# Patient Record
Sex: Female | Born: 1965 | Race: White | Hispanic: No | Smoking: Never smoker
Health system: Southern US, Community
[De-identification: ages and names within clinical notes are randomized; demographics above are authoritative.]

## PROBLEM LIST (undated history)

## (undated) DIAGNOSIS — B029 Zoster without complications: Secondary | ICD-10-CM

## (undated) DIAGNOSIS — Z9109 Other allergy status, other than to drugs and biological substances: Secondary | ICD-10-CM

## (undated) DIAGNOSIS — G43909 Migraine, unspecified, not intractable, without status migrainosus: Secondary | ICD-10-CM

## (undated) DIAGNOSIS — J45990 Exercise induced bronchospasm: Secondary | ICD-10-CM

## (undated) HISTORY — DX: Exercise induced bronchospasm: J45.990

## (undated) HISTORY — DX: Migraine, unspecified, not intractable, without status migrainosus: G43.909

## (undated) HISTORY — PX: NASAL SEPTUM SURGERY: SHX37

## (undated) HISTORY — DX: Zoster without complications: B02.9

## (undated) HISTORY — PX: EYE SURGERY: SHX253

## (undated) HISTORY — PX: TONSILLECTOMY: SUR1361

## (undated) HISTORY — DX: Other allergy status, other than to drugs and biological substances: Z91.09

---

## 1997-08-28 ENCOUNTER — Other Ambulatory Visit: Admission: RE | Admit: 1997-08-28 | Discharge: 1997-08-28 | Payer: Self-pay | Admitting: Obstetrics and Gynecology

## 1997-10-16 ENCOUNTER — Ambulatory Visit (HOSPITAL_COMMUNITY): Admission: RE | Admit: 1997-10-16 | Discharge: 1997-10-16 | Payer: Self-pay | Admitting: Obstetrics and Gynecology

## 1998-03-19 ENCOUNTER — Inpatient Hospital Stay (HOSPITAL_COMMUNITY): Admission: AD | Admit: 1998-03-19 | Discharge: 1998-03-21 | Payer: Self-pay | Admitting: Obstetrics and Gynecology

## 2002-05-17 ENCOUNTER — Other Ambulatory Visit: Admission: RE | Admit: 2002-05-17 | Discharge: 2002-05-17 | Payer: Self-pay | Admitting: Obstetrics and Gynecology

## 2003-08-28 ENCOUNTER — Other Ambulatory Visit: Admission: RE | Admit: 2003-08-28 | Discharge: 2003-08-28 | Payer: Self-pay | Admitting: Obstetrics and Gynecology

## 2004-10-01 ENCOUNTER — Other Ambulatory Visit: Admission: RE | Admit: 2004-10-01 | Discharge: 2004-10-01 | Payer: Self-pay | Admitting: Obstetrics and Gynecology

## 2005-10-22 ENCOUNTER — Other Ambulatory Visit: Admission: RE | Admit: 2005-10-22 | Discharge: 2005-10-22 | Payer: Self-pay | Admitting: Obstetrics and Gynecology

## 2006-10-05 ENCOUNTER — Encounter: Admission: RE | Admit: 2006-10-05 | Discharge: 2006-10-05 | Payer: Self-pay | Admitting: Obstetrics and Gynecology

## 2006-11-16 ENCOUNTER — Ambulatory Visit: Payer: Self-pay | Admitting: Family Medicine

## 2006-11-16 DIAGNOSIS — J309 Allergic rhinitis, unspecified: Secondary | ICD-10-CM | POA: Insufficient documentation

## 2006-11-16 DIAGNOSIS — G43009 Migraine without aura, not intractable, without status migrainosus: Secondary | ICD-10-CM | POA: Insufficient documentation

## 2006-11-16 DIAGNOSIS — D649 Anemia, unspecified: Secondary | ICD-10-CM

## 2006-11-16 DIAGNOSIS — Z91011 Allergy to milk products: Secondary | ICD-10-CM

## 2006-11-16 DIAGNOSIS — J45990 Exercise induced bronchospasm: Secondary | ICD-10-CM | POA: Insufficient documentation

## 2006-12-07 ENCOUNTER — Other Ambulatory Visit: Admission: RE | Admit: 2006-12-07 | Discharge: 2006-12-07 | Payer: Self-pay | Admitting: Family Medicine

## 2006-12-07 ENCOUNTER — Encounter (INDEPENDENT_AMBULATORY_CARE_PROVIDER_SITE_OTHER): Payer: Self-pay | Admitting: Internal Medicine

## 2006-12-07 ENCOUNTER — Ambulatory Visit: Payer: Self-pay | Admitting: Family Medicine

## 2006-12-16 LAB — CONVERTED CEMR LAB
Albumin: 3.5 g/dL (ref 3.5–5.2)
Alkaline Phosphatase: 55 units/L (ref 39–117)
BUN: 6 mg/dL (ref 6–23)
Basophils Absolute: 0 10*3/uL (ref 0.0–0.1)
Cholesterol: 154 mg/dL (ref 0–200)
GFR calc Af Amer: 120 mL/min
HCT: 36 % (ref 36.0–46.0)
HDL: 34.9 mg/dL — ABNORMAL LOW (ref >39.0)
Hemoglobin: 12.2 g/dL (ref 12.0–15.0)
Lymphocytes Relative: 17.3 % (ref 12.0–46.0)
MCHC: 34 g/dL (ref 30.0–36.0)
MCV: 95.5 fL (ref 78.0–100.0)
Monocytes Absolute: 0.7 10*3/uL (ref 0.2–0.7)
Monocytes Relative: 10.8 % (ref 3.0–11.0)
Neutro Abs: 4.3 10*3/uL (ref 1.4–7.7)
Neutrophils Relative %: 69.1 % (ref 43.0–77.0)
Potassium: 4.2 meq/L (ref 3.5–5.1)
Sodium: 134 meq/L — ABNORMAL LOW (ref 135–145)
TSH: 1.41 microintl units/mL (ref 0.35–5.50)
Total Bilirubin: 0.6 mg/dL (ref 0.3–1.2)
Total Protein: 6.7 g/dL (ref 6.0–8.3)

## 2007-03-23 ENCOUNTER — Ambulatory Visit: Payer: Self-pay | Admitting: Family Medicine

## 2007-03-23 DIAGNOSIS — M25519 Pain in unspecified shoulder: Secondary | ICD-10-CM

## 2007-03-23 DIAGNOSIS — R21 Rash and other nonspecific skin eruption: Secondary | ICD-10-CM

## 2007-09-16 ENCOUNTER — Ambulatory Visit: Payer: Self-pay | Admitting: Internal Medicine

## 2007-09-16 DIAGNOSIS — L723 Sebaceous cyst: Secondary | ICD-10-CM

## 2007-09-30 ENCOUNTER — Telehealth (INDEPENDENT_AMBULATORY_CARE_PROVIDER_SITE_OTHER): Payer: Self-pay | Admitting: *Deleted

## 2007-10-21 ENCOUNTER — Encounter: Payer: Self-pay | Admitting: Internal Medicine

## 2007-10-24 ENCOUNTER — Telehealth: Payer: Self-pay | Admitting: Internal Medicine

## 2007-10-25 ENCOUNTER — Telehealth: Payer: Self-pay | Admitting: Internal Medicine

## 2010-01-10 ENCOUNTER — Ambulatory Visit: Payer: Self-pay | Admitting: Emergency Medicine

## 2010-06-17 NOTE — Progress Notes (Signed)
Summary: Refill Augmentin  Phone Note From Pharmacy Call back at [phone]   Caller: [facility] Call For: Dr. Alphonsus Sias  Summary of Call: Received refill request from pharmacy for Augmentin 875 mg. Take one by mouth twice daily for skin. # 20. Initial call taken by: Sydell Axon,  October 24, 2007 8:40 AM  Follow-up for Phone Call        okay to refill #20 x 0 If cyst doesn't clear or keeps recurring, needs to be checked again Follow-up by: Cindee Salt MD,  October 24, 2007 9:09 AM  Additional Follow-up for Phone Call Additional follow up Details #1::        Rx called to pharmacy.  Left detailed message on pt's VM. Additional Follow-up by: Sydell Axon,  October 24, 2007 9:47 AM

## 2010-11-06 ENCOUNTER — Ambulatory Visit: Payer: Self-pay | Admitting: Family Medicine

## 2012-11-17 ENCOUNTER — Ambulatory Visit: Payer: Self-pay | Admitting: Family Medicine

## 2013-04-11 ENCOUNTER — Ambulatory Visit (INDEPENDENT_AMBULATORY_CARE_PROVIDER_SITE_OTHER): Payer: BC Managed Care – PPO

## 2013-04-11 ENCOUNTER — Ambulatory Visit (INDEPENDENT_AMBULATORY_CARE_PROVIDER_SITE_OTHER): Payer: BC Managed Care – PPO | Admitting: Podiatry

## 2013-04-11 ENCOUNTER — Encounter: Payer: Self-pay | Admitting: Podiatry

## 2013-04-11 VITALS — BP 114/71 | HR 94 | Resp 16 | Ht 63.0 in | Wt 205.0 lb

## 2013-04-11 DIAGNOSIS — M79609 Pain in unspecified limb: Secondary | ICD-10-CM

## 2013-04-11 DIAGNOSIS — M7660 Achilles tendinitis, unspecified leg: Secondary | ICD-10-CM

## 2013-04-11 DIAGNOSIS — M79672 Pain in left foot: Secondary | ICD-10-CM

## 2013-04-11 MED ORDER — TRIAMCINOLONE ACETONIDE 10 MG/ML IJ SUSP
10.0000 mg | Freq: Once | INTRAMUSCULAR | Status: AC
  Administered 2013-04-11: 10 mg

## 2013-04-11 NOTE — Progress Notes (Signed)
  Subjective:    Patient ID: Hannah Chang, female    DOB: Jul 01, 1965, 47 y.o.   MRN: [id number]  HPI Comments: N PAIN L LEFT ACHILLES D AUGUST 2014 O SLOWLY C WORSE A SHOES, AFTER SITTING T 0     Review of Systems  Constitutional: Negative.   HENT: Negative.   Eyes: Negative.   Respiratory: Negative.   Cardiovascular: Negative.   Gastrointestinal: Negative.   Endocrine: Negative.   Genitourinary: Negative.   Musculoskeletal: Negative.   Skin: Negative.   Allergic/Immunologic: Positive for food allergies.  Neurological: Negative.   Hematological: Negative.   Psychiatric/Behavioral: Negative.        Objective:   Physical Exam        Assessment & Plan:

## 2013-04-12 NOTE — Progress Notes (Signed)
Subjective:     Patient ID: Hannah Chang, female   DOB: 08/14/1965, 47 y.o.   MRN: [id number]  HPI patient presents stating I have had trouble with this left heel for about 4 months. States that she's not been able to wear any shoes with Bax and and become sore when pressed on. She is able to walk in flip-flops without pain   Review of Systems  All other systems reviewed and are negative.       Objective:   Physical Exam  Nursing note and vitals reviewed. Constitutional: She is oriented to person, place, and time.  Cardiovascular: Intact distal pulses.   Musculoskeletal: Normal range of motion.  Neurological: She is oriented to person, place, and time.  Skin: Skin is warm.   patient is found to have inflammation and pain left Achilles medial side with fluid buildup noted at this area central and lateral is intact with no inflammation or pain noted. No equinus condition and muscle strength was found to be normal with normal neurovascular status     Assessment:     Probable medial Achilles tendinitis left with inflammation noted    Plan:     Reviewed H&P and x-ray. Advised on careful injection and did discuss risk of rupture associated with this. Patient wants to accept risk and today I did a sterile prep and carefully injected the medial side not putting it directly into the Achilles tendon with 3 mg dexamethasone Kenalog 5 mg Xylocaine and advised him reduced activity. Reappoint 3 weeks to recheck

## 2013-07-25 ENCOUNTER — Ambulatory Visit (INDEPENDENT_AMBULATORY_CARE_PROVIDER_SITE_OTHER): Payer: BC Managed Care – PPO | Admitting: Podiatry

## 2013-07-25 VITALS — BP 109/60 | HR 99 | Resp 16 | Ht 63.0 in | Wt 195.0 lb

## 2013-07-25 DIAGNOSIS — M7660 Achilles tendinitis, unspecified leg: Secondary | ICD-10-CM

## 2013-07-25 MED ORDER — TRIAMCINOLONE ACETONIDE 10 MG/ML IJ SUSP
10.0000 mg | Freq: Once | INTRAMUSCULAR | Status: AC
  Administered 2013-07-25: 10 mg

## 2013-07-25 NOTE — Progress Notes (Signed)
Subjective:     Patient ID: Hannah Chang, female   DOB: 02/16/1966, 48 y.o.   MRN: [id number]  HPI patient presents stating my heel started to hurt me again in the last few weeks and it seems to be when I'm wearing tighter type of shoes   Review of Systems     Objective:   Physical Exam Neurovascular status intact no health history changes noted with inflammation of the medial side Achilles right with a small area of redness that is very tender when pressed central and lateral band are very strong and functioning well    Assessment:     Appears to be medial band Achilles tendinitis right with inflammation    Plan:     Discussed one more injection along with him mobilization. Today I did a careful injection of the medial side after explaining chances or rupture to patient of which she is comfortable with I injected with dexamethasone Kenalog 3 mg 5 mg Xylocaine and then dispensed night splint with all instructions on usage reappoint to recheck again in the next 3 weeks or earlier if any issues should occur

## 2013-07-25 NOTE — Patient Instructions (Signed)

## 2014-08-09 ENCOUNTER — Ambulatory Visit: Payer: Self-pay

## 2014-10-18 ENCOUNTER — Telehealth: Payer: Self-pay

## 2014-10-18 MED ORDER — LEVONORGESTREL-ETHINYL ESTRAD 0.1-20 MG-MCG PO TABS: 1.0000 | ORAL_TABLET | Freq: Every day | ORAL | Status: DC

## 2014-10-18 NOTE — Telephone Encounter (Signed)
She is skipping the placebo week of pills and going straight into a new pack. She has run out early now. She wants to know if you can call in one pack to her local pharmacy to cover her until the next mail order pack comes in. [facility] [phone]

## 2015-01-14 ENCOUNTER — Telehealth: Payer: Self-pay

## 2015-01-14 NOTE — Telephone Encounter (Signed)
I tried to call the patient about a medication that we got a refill request for her from [facility] but it looks like this medication was refilled on 10/17/14 with 11 refills but at a different pharmacy. So I left a voicemail asking for her to return my call so I can find out what exactly is going on.

## 2015-01-16 NOTE — Telephone Encounter (Signed)
Patient called back this morning and did not know she had 11 refills on the medication but she is wanting to switch to a different pharmacy. Stated she was going to call and switch pharmacies. I told her to call me back if she has any issues.

## 2015-10-11 ENCOUNTER — Ambulatory Visit (INDEPENDENT_AMBULATORY_CARE_PROVIDER_SITE_OTHER): Payer: Self-pay | Admitting: Family Medicine

## 2015-10-11 ENCOUNTER — Encounter: Payer: Self-pay | Admitting: Family Medicine

## 2015-10-11 VITALS — BP 132/86 | HR 86 | Temp 99.0°F | Ht 62.6 in | Wt 220.0 lb

## 2015-10-11 DIAGNOSIS — Z3041 Encounter for surveillance of contraceptive pills: Secondary | ICD-10-CM

## 2015-10-11 DIAGNOSIS — B029 Zoster without complications: Secondary | ICD-10-CM

## 2015-10-11 MED ORDER — VALACYCLOVIR HCL 1 G PO TABS: 1000.0000 mg | ORAL_TABLET | Freq: Three times a day (TID) | ORAL | Status: DC

## 2015-10-11 MED ORDER — LEVONORGESTREL-ETHINYL ESTRAD 0.1-20 MG-MCG PO TABS: 1.0000 | ORAL_TABLET | Freq: Every day | ORAL | Status: DC

## 2015-10-11 NOTE — Patient Instructions (Signed)
Shingles Shingles is an infection that causes a painful skin rash and fluid-filled blisters. Shingles is caused by the same virus that causes chickenpox. Shingles only develops in people who:  Have had chickenpox.  Have gotten the chickenpox vaccine. (This is rare.) The first symptoms of shingles may be itching, tingling, or pain in an area on your skin. A rash will follow in a few days or weeks. The rash is usually on one side of the body in a bandlike or beltlike pattern. Over time, the rash turns into fluid-filled blisters that break open, scab over, and dry up. Medicines may:  Help you manage pain.  Help you recover more quickly.  Help to prevent long-term problems. HOME CARE Medicines  Take medicines only as told by your doctor.  Apply an anti-itch or numbing cream to the affected area as told by your doctor. Blister and Rash Care  Take a cool bath or put cool compresses on the area of the rash or blisters as told by your doctor. This may help with pain and itching.  Keep your rash covered with a loose bandage (dressing). Wear loose-fitting clothing.  Keep your rash and blisters clean with mild soap and cool water or as told by your doctor.  Check your rash every day for signs of infection. These include redness, swelling, and pain that lasts or gets worse.  Do not pick your blisters.  Do not scratch your rash. General Instructions  Rest as told by your doctor.  Keep all follow-up visits as told by your doctor. This is important.  Until your blisters scab over, your infection can cause chickenpox in people who have never had it or been vaccinated against it. To prevent this from happening, avoid touching other people or being around other people, especially:  Babies.  Pregnant women.  Children who have eczema.  Elderly people who have transplants.  People who have chronic illnesses, such as leukemia or AIDS. GET HELP IF:  Your pain does not get better with  medicine.  Your pain does not get better after the rash heals.  Your rash looks infected. Signs of infection include:  Redness.  Swelling.  Pain that lasts or gets worse. GET HELP RIGHT AWAY IF:  The rash is on your face or nose.  You have pain in your face, pain around your eye area, or loss of feeling on one side of your face.  You have ear pain or you have ringing in your ear.  You have loss of taste.  Your condition gets worse.   This information is not intended to replace advice given to you by your health care provider. Make sure you discuss any questions you have with your health care provider.   Document Released: 10/21/2007 Document Revised: 05/25/2014 Document Reviewed: 02/13/2014 Elsevier Interactive Patient Education 2016 [employer] Inc.  

## 2015-10-11 NOTE — Progress Notes (Signed)
BP 132/86 mmHg  Pulse 86  Temp(Src) 99 F (37.2 C)  Ht 5' 2.6" (1.59 m)  Wt 220 lb (99.791 kg)  BMI 39.47 kg/m2  SpO2 100%   Subjective:    Patient ID: Hannah Chang, female    DOB: 06/03/1965, 50 y.o.   MRN: [id number]  HPI: Hannah Chang is a 50 y.o. female  Chief Complaint  Patient presents with  . Rash    x 2 weeks on this coming Sunday.  . Contraception    Patient is scheduled with [facility] in July, she would like to know if she can get enough medication to hold her over   CONTRACEPTION CONCERNS Contraception: OCP Previous contraception: OCP Average interval between menses:  Length of menses: doesn't have one Flow: doesn't have one, dosed continuously Dysmenorrhea: doesn't have one, dosed continuously  RASH Duration:  2 weeks- thought it was insect bites  Location: L buttock  Itching: yes- first day Burning: no Redness: yes Oozing: no Scaling: no Blisters: yes- previously when it started Painful: no Fevers: no Change in detergents/soaps/personal care products: no Recent illness: no Recent travel:no History of same: no Context: better Alleviating factors: nothing Treatments attempted:hydrocortisone cream, benadryl and lotion/moisturizer Shortness of breath: no  Throat/tongue swelling: no Myalgias/arthralgias: no  Relevant past medical, surgical, family and social history reviewed and updated as indicated. Interim medical history since our last visit reviewed. Allergies and medications reviewed and updated.  Review of Systems  Constitutional: Negative.   Respiratory: Negative.   Cardiovascular: Negative.   Musculoskeletal: Negative.   Skin: Positive for rash. Negative for color change, pallor and wound.  Psychiatric/Behavioral: Negative.     Per HPI unless specifically indicated above     Objective:    BP 132/86 mmHg  Pulse 86  Temp(Src) 99 F (37.2 C)  Ht 5' 2.6" (1.59 m)  Wt 220 lb (99.791 kg)  BMI 39.47 kg/m2  SpO2 100%  Wt  Readings from Last 3 Encounters:  10/11/15 220 lb (99.791 kg)  07/25/13 195 lb (88.451 kg)  04/11/13 205 lb (92.987 kg)    Physical Exam  Constitutional: She is oriented to person, place, and time. She appears well-developed and well-nourished. No distress.  HENT:  Head: Normocephalic and atraumatic.  Right Ear: Hearing normal.  Left Ear: Hearing normal.  Nose: Nose normal.  Eyes: Conjunctivae and lids are normal. Right eye exhibits no discharge. Left eye exhibits no discharge. No scleral icterus.  Pulmonary/Chest: Effort normal. No respiratory distress.  Musculoskeletal: Normal range of motion.  Neurological: She is alert and oriented to person, place, and time.  Skin: Skin is warm, dry and intact. Rash (vessicular rash, dried up on L buttock) noted. No erythema. No pallor.  Psychiatric: She has a normal mood and affect. Her speech is normal and behavior is normal. Judgment and thought content normal. Cognition and memory are normal.  Nursing note and vitals reviewed.   Results for orders placed or performed in visit on 12/07/06  [facility]  Result Value Ref Range   Cholesterol 154 0-200 mg/dL   HDL 62.934.9 (L) >52.8>39.0 mg/dL   Triglycerides 56 08-1318-149 mg/dL   Total CHOL/HDL Ratio 4.4 CALC    LDL Cholesterol 108 (H) 0-99 mg/dL   VLDL 11 4-400-40 mg/dL   WBC 6.2 1.0-27.24.5-10.5 53*6/UYQIHKVQQV10*3/microliter   HCT 36.0 36.0-46.0 %   Hemoglobin 12.2 12.0-15.0 g/dL   Lymphocytes Relative 17.3 12.0-46.0 %   MCV 95.5 78.0-100.0 fL   MCHC 34.0 30.0-36.0 g/dL  Monocytes Relative 10.8 3.0-11.0 %   Platelets 346 150-400 K/uL   RBC 3.76 (L) 3.87-5.11 M/uL   RDW 11.9 11.5-14.6 %   Basophils Relative 0.5 0.0-1.0 %   Eosinophils Relative 2.3 0.0-5.0 %   Neutrophils Relative % 69.1 43.0-77.0 %   Monocytes Absolute 0.7 0.2-0.7 K/uL   Neutro Abs 4.3 1.4-7.7 K/uL   Eosinophils Absolute 0.1 0.0-0.6 K/uL   Basophils Absolute 0.0 0.0-0.1 K/uL   BUN 6 6-23 mg/dL   Calcium 8.8 1.6-10.9 mg/dL   Chloride 604 54-098  meq/L   Creatinine, Ser 0.7 0.4-1.2 mg/dL   CO2 25 11-91 meq/L   Glucose, Bld 103 (H) 70-99 mg/dL   Sodium 478 (L) 295-621 meq/L   Potassium 4.2 3.5-5.1 meq/L   GFR calc Af Amer 120 mL/min   GFR calc non Af Amer 100 mL/min   Albumin 3.5 3.5-5.2 g/dL   Alkaline Phosphatase 55 39-117 units/L   ALT 18 0-35 units/L   AST 17 0-37 units/L   Total Bilirubin 0.6 0.3-1.2 mg/dL   Total Protein 6.7 3.0-8.6 g/dL   Bilirubin, Direct 0.1 0.0-0.3 mg/dL   TSH 5.78 4.69-6.29 microintl units/mL      Assessment & Plan:   Problem List Items Addressed This Visit    None    Visit Diagnoses    Shingles    -  Primary    Already dried up. Valcyclovir Rx given in case it comes back. Discussed shingles. Call with any concerns.     Relevant Medications    valACYclovir (VALTREX) 1000 MG tablet    Encounter for surveillance of contraceptive pills        Refill of OCP given today. Follow up with GYN as needed.         Follow up plan: Return if symptoms worsen or fail to improve.

## 2015-10-28 ENCOUNTER — Telehealth: Payer: Self-pay | Admitting: Family Medicine

## 2015-10-28 MED ORDER — VALACYCLOVIR HCL 1 G PO TABS: 1000.0000 mg | ORAL_TABLET | Freq: Three times a day (TID) | ORAL | Status: DC

## 2015-10-28 MED ORDER — LIDOCAINE 5 % EX OINT: 1.0000 "application " | TOPICAL_OINTMENT | CUTANEOUS | Status: DC | PRN

## 2015-10-28 NOTE — Telephone Encounter (Signed)
Has been under a lot of stress. Has had another outbreak of her shingles again. She is going to restart her valtrex. Is painful at this time. Will start lidocaine topical to help with the pain. Will call with any concerns.

## 2015-10-28 NOTE — Telephone Encounter (Signed)
Pt would like to speak with someone about what she should expect while dealing with shingles.

## 2015-10-28 NOTE — Telephone Encounter (Signed)
Spoke with patient, she states that the rash has come back, she would like to know how often the rash will reappear, she is going to get the medication today, she is just in severe pain. She is unsure of what to do.

## 2015-10-29 ENCOUNTER — Other Ambulatory Visit: Payer: Self-pay | Admitting: Family Medicine

## 2015-10-29 MED ORDER — VALACYCLOVIR HCL 1 G PO TABS: 1000.0000 mg | ORAL_TABLET | Freq: Three times a day (TID) | ORAL | Status: DC

## 2015-11-11 ENCOUNTER — Telehealth: Payer: Self-pay

## 2015-11-11 MED ORDER — AMITRIPTYLINE HCL 25 MG PO TABS: 25.0000 mg | ORAL_TABLET | Freq: Every day | ORAL | Status: DC

## 2015-11-11 NOTE — Telephone Encounter (Signed)
Rx printed. Will not continue valtrex as it is just pain. Will start her on amitriptyline for the post-herpetic neuralgia. Rx sent to her.

## 2015-11-11 NOTE — Telephone Encounter (Signed)
Patient called nurse line concerned about Shingles, she wanted to know if she could continue to take Valtrex at a low dose to prevent another Shingles outbreak.  Verbal from Dr. Laural BenesJohnson, this will not prevent another out break, if she does have lesions, than yes she can take the low dose, if no lesions, dosen't need to continue the medicine, this is not used as a prophylactic drug. Called and left a message for patient to return my call so that I can discuss this with her.

## 2015-11-11 NOTE — Telephone Encounter (Signed)
Spoke with patient, she wants to know if she can continue the Valtrex to help with the pain that she still has after the initial break out of Shingles.

## 2015-11-21 ENCOUNTER — Telehealth: Payer: Self-pay | Admitting: Obstetrics and Gynecology

## 2015-11-21 ENCOUNTER — Encounter: Payer: Self-pay | Admitting: Obstetrics and Gynecology

## 2015-11-21 NOTE — Telephone Encounter (Addendum)
Pt needs 55mo refill on her birth control pills that she takes back to back. Pt was supposed to have AE on 11/21/15, but MD was in surgery and appt had to be rescheduled to mid Aug. Pt uses the [facility] on [address] since it's cheaper for people with out insurance

## 2015-11-22 ENCOUNTER — Telehealth: Payer: Self-pay | Admitting: Unknown Physician Specialty

## 2015-11-22 MED ORDER — LEVONORGESTREL-ETHINYL ESTRAD 0.1-20 MG-MCG PO TABS: 1.0000 | ORAL_TABLET | Freq: Every day | ORAL | Status: DC

## 2015-11-22 NOTE — Telephone Encounter (Signed)
Pt's appt w [facility] got rescheduled to Aug 23.  She will need 2 more packs of Aviane birthcontrol to get through to her appt.  Can we please send in for this?  [facility] [address]

## 2015-11-22 NOTE — Telephone Encounter (Signed)
Dr. Henriette CombsJohnson's pt

## 2015-11-22 NOTE — Telephone Encounter (Signed)
Left message to contact PCP that she has not been seen by Dr. Valentino Saxonherry yet and apologized for the inconvience. Until we see her we cannot prescribe her medication.

## 2015-11-22 NOTE — Telephone Encounter (Signed)
Routing to provider  

## 2015-11-22 NOTE — Telephone Encounter (Signed)
Rx sent to her pharmacy 

## 2016-01-08 ENCOUNTER — Ambulatory Visit (INDEPENDENT_AMBULATORY_CARE_PROVIDER_SITE_OTHER): Payer: Self-pay | Admitting: Obstetrics and Gynecology

## 2016-01-08 ENCOUNTER — Other Ambulatory Visit: Payer: Self-pay | Admitting: Obstetrics and Gynecology

## 2016-01-08 ENCOUNTER — Encounter: Payer: Self-pay | Admitting: Obstetrics and Gynecology

## 2016-01-08 VITALS — BP 126/76 | HR 90 | Ht 62.5 in | Wt 227.0 lb

## 2016-01-08 DIAGNOSIS — Z8742 Personal history of other diseases of the female genital tract: Secondary | ICD-10-CM

## 2016-01-08 DIAGNOSIS — Z1239 Encounter for other screening for malignant neoplasm of breast: Secondary | ICD-10-CM

## 2016-01-08 DIAGNOSIS — Z803 Family history of malignant neoplasm of breast: Secondary | ICD-10-CM

## 2016-01-08 DIAGNOSIS — Z131 Encounter for screening for diabetes mellitus: Secondary | ICD-10-CM

## 2016-01-08 DIAGNOSIS — Z13220 Encounter for screening for lipoid disorders: Secondary | ICD-10-CM

## 2016-01-08 DIAGNOSIS — Z01419 Encounter for gynecological examination (general) (routine) without abnormal findings: Secondary | ICD-10-CM

## 2016-01-08 DIAGNOSIS — Z124 Encounter for screening for malignant neoplasm of cervix: Secondary | ICD-10-CM

## 2016-01-08 MED ORDER — LEVONORGESTREL-ETHINYL ESTRAD 0.1-20 MG-MCG PO TABS: 1.0000 | ORAL_TABLET | Freq: Every day | ORAL | 11 refills | Status: DC

## 2016-01-08 NOTE — Progress Notes (Signed)
GYNECOLOGY ANNUAL PHYSICAL EXAM PROGRESS NOTE  Subjective:    Hannah Chang is a 50 y.o. G86P2002 single female who presents for an annual exam. The patient has no complaints today. The patient is not currently sexually active. The patient wears seatbelts: yes. The patient participates in regular exercise: yes. Has the patient ever been transfused or tattooed?: no. The patient reports that there is not domestic violence in her life.    Gynecologic History No LMP recorded. Patient is not currently having periods (Reason: Oral contraceptives). Menstrual History: OB History    Gravida Para Term Preterm AB Living   2 2 2     2    SAB TAB Ectopic Multiple Live Births           2      Menarche age: 4 No LMP recorded. Patient is not currently having periods (Reason: Oral contraceptives).    Contraception: OCP (estrogen/progesterone) History of STI's: Denies Last Pap: 2016. Results were: abnormal (patient cannot recall abnormality but notes she had a colposcopy last year which was normal.  Last mammogram: 2016. Results were: negative   Obstetric History   G2   P2   T2   P0   A0   L2    SAB0   TAB0   Ectopic0   Multiple0   Live Births2     # Outcome Date GA Lbr Len/2nd Weight Sex Delivery Anes PTL Lv  2 Term     M Vag-Spont   LIV  1 Term     F CS-Unspec   LIV      Past Medical History:  Diagnosis Date  . Environmental allergies   . Exercise-induced asthma   . Migraines   . Shingles     Past Surgical History:  Procedure Laterality Date  . CESAREAN SECTION  1992  . EYE SURGERY    . NASAL SEPTUM SURGERY     X 2  . TONSILLECTOMY      Family History  Problem Relation Age of Onset  . Breast cancer Mother     Social History   Social History  . Marital status: Divorced    Spouse name: N/A  . Number of children: N/A  . Years of education: N/A   Occupational History  . Not on file.   Social History Main Topics  . Smoking status: Never Smoker  . Smokeless  tobacco: Never Used  . Alcohol use Yes     Comment: ONCE A MONTH  . Drug use: No  . Sexual activity: Not Currently    Birth control/ protection: None   Other Topics Concern  . Not on file   Social History Narrative  . No narrative on file    Current Outpatient Prescriptions on File Prior to Visit  Medication Sig Dispense Refill  . Albuterol Sulfate (PROAIR HFA IN) Inhale into the lungs as needed.    . fexofenadine (ALLEGRA) 180 MG tablet Take 180 mg by mouth as needed for allergies or rhinitis.    . fluticasone (FLONASE) 50 MCG/ACT nasal spray Place into both nostrils daily.    Marland Kitchen levonorgestrel-ethinyl estradiol (AVIANE) 0.1-20 MG-MCG tablet Take 1 tablet by mouth daily. TO BE TAKEN CONTINUOUSLY 3 Package 1  . lidocaine (XYLOCAINE) 5 % ointment Apply 1 application topically as needed. 35.44 g 0  . valACYclovir (VALTREX) 1000 MG tablet Take 1 tablet (1,000 mg total) by mouth 3 (three) times daily. Take at first sign of rash 21 tablet 2  No current facility-administered medications on file prior to visit.     Allergies  Allergen Reactions  . Cephalexin     REACTION: whelps  . Doxycycline Hyclate     REACTION: as child  . Erythromycin     REACTION: as child  . Sulfa Antibiotics Other (See Comments)    UNKNOWN     Review of Systems Constitutional: negative for chills, fatigue, fevers and sweats Eyes: negative for irritation, redness and visual disturbance Ears, nose, mouth, throat, and face: negative for hearing loss, nasal congestion, snoring and tinnitus Respiratory: negative for asthma, cough, sputum Cardiovascular: negative for chest pain, dyspnea, exertional chest pressure/discomfort, irregular heart beat, palpitations and syncope Gastrointestinal: negative for abdominal pain, change in bowel habits, nausea and vomiting Genitourinary: negative for abnormal menstrual periods, genital lesions, sexual problems and vaginal discharge, dysuria and urinary  incontinence Integument/breast: negative for breast lump, breast tenderness and nipple discharge Hematologic/lymphatic: negative for bleeding and easy bruising Musculoskeletal:negative for back pain and muscle weakness Neurological: negative for dizziness, headaches, vertigo and weakness Endocrine: negative for diabetic symptoms including polydipsia, polyuria and skin dryness Allergic/Immunologic: negative for hay fever and urticaria       Objective:  Blood pressure 126/76, pulse 90, height 5' 2.5" (1.588 m), weight 227 lb (103 kg). Body mass index is 40.86 kg/m.  General Appearance:    Alert, cooperative, no distress, appears stated age, morbidly obese  Head:    Normocephalic, without obvious abnormality, atraumatic  Eyes:    PERRL, conjunctiva/corneas clear, EOM's intact, both eyes  Ears:    Normal external ear canals, both ears  Nose:   Nares normal, septum midline, mucosa normal, no drainage or sinus tenderness  Throat:   Lips, mucosa, and tongue normal; teeth and gums normal  Neck:   Supple, symmetrical, trachea midline, no adenopathy; thyroid: no enlargement/tenderness/nodules; no carotid bruit or JVD  Back:     Symmetric, no curvature, ROM normal, no CVA tenderness  Lungs:     Clear to auscultation bilaterally, respirations unlabored  Chest Wall:    No tenderness or deformity   Heart:    Regular rate and rhythm, S1 and S2 normal, no murmur, rub or gallop  Breast Exam:    No tenderness, masses, or nipple abnormality  Abdomen:     Soft, non-tender, bowel sounds active all four quadrants, no masses, no organomegaly.    Genitalia:    Pelvic:external genitalia normal, vagina without lesions, discharge, or tenderness, rectovaginal septum  normal. Cervix normal in appearance, no cervical motion tenderness, no adnexal masses or tenderness.  Uterus normal size, shape, mobile, regular contours, nontender.  Rectal:    Normal external sphincter.  No hemorrhoids appreciated. Internal exam not  done.   Extremities:   Extremities normal, atraumatic, no cyanosis or edema  Pulses:   2+ and symmetric all extremities  Skin:   Skin color, texture, turgor normal, no rashes or lesions  Lymph nodes:   Cervical, supraclavicular, and axillary nodes normal  Neurologic:   CNII-XII intact, normal strength, sensation and reflexes throughout   .  Labs:  Lab Results  Component Value Date   WBC 6.2 12/07/2006   HGB 12.2 12/07/2006   HCT 36.0 12/07/2006   MCV 95.5 12/07/2006   PLT 346 12/07/2006    Lab Results  Component Value Date   CREATININE 0.7 12/07/2006   BUN 6 12/07/2006   NA 134 (L) 12/07/2006   K 4.2 12/07/2006   CL 104 12/07/2006   CO2 25 12/07/2006  Lab Results  Component Value Date   ALT 18 12/07/2006   AST 17 12/07/2006   ALKPHOS 55 12/07/2006   BILITOT 0.6 12/07/2006    Lab Results  Component Value Date   TSH 1.41 12/07/2006     Assessment:   Routine gynecologic exam.  Morbid obesity H/o abnormal pap smear Family h/o breast cancer  Plan:    Blood tests ordered: CBC, CMP, TSH, Lipid panel, random glucose.  Patient will return fasting.  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  Breast self exam technique reviewed and patient encouraged to perform self-exam monthly. Contraception: OCP (estrogen/progesterone) and desires refill.  Refill given. Discussed healthy lifestyle modifications. Pap smear performed today for screening and h/o abnormal pap smear. Mammogram ordered. Family h/o breast cancer (in mother).  Discussion had on genetic screening.  Patient currently without insurance, but may consider once she does. Given handout.  To f/u in 1 year for annual exam.     Hildred LaserAnika Tanee Henery, MD [facility]

## 2016-01-08 NOTE — Patient Instructions (Addendum)
Health Maintenance, Female Adopting a healthy lifestyle and getting preventive care can go a long way to promote health and wellness. Talk with your health care provider about what schedule of regular examinations is right for you. This is a good chance for you to check in with your provider about disease prevention and staying healthy. In between checkups, there are plenty of things you can do on your own. Experts have done a lot of research about which lifestyle changes and preventive measures are most likely to keep you healthy. Ask your health care provider for more information. WEIGHT AND DIET  Eat a healthy diet  Be sure to include plenty of vegetables, fruits, low-fat dairy products, and lean protein.  Do not eat a lot of foods high in solid fats, added sugars, or salt.  Get regular exercise. This is one of the most important things you can do for your health.  Most adults should exercise for at least 150 minutes each week. The exercise should increase your heart rate and make you sweat (moderate-intensity exercise).  Most adults should also do strengthening exercises at least twice a week. This is in addition to the moderate-intensity exercise.  Maintain a healthy weight  Body mass index (BMI) is a measurement that can be used to identify possible weight problems. It estimates body fat based on height and weight. Your health care provider can help determine your BMI and help you achieve or maintain a healthy weight.  For females 20 years of age and older:   A BMI below 18.5 is considered underweight.  A BMI of 18.5 to 24.9 is normal.  A BMI of 25 to 29.9 is considered overweight.  A BMI of 30 and above is considered obese.  Watch levels of cholesterol and blood lipids  You should start having your blood tested for lipids and cholesterol at 48 years of age, then have this test every 5 years.  You may need to have your cholesterol levels checked more often if:  Your lipid  or cholesterol levels are high.  You are older than 48 years of age.  You are at high risk for heart disease.  CANCER SCREENING   Lung Cancer  Lung cancer screening is recommended for adults 55-80 years old who are at high risk for lung cancer because of a history of smoking.  A yearly low-dose CT scan of the lungs is recommended for people who:  Currently smoke.  Have quit within the past 15 years.  Have at least a 30-pack-year history of smoking. A pack year is smoking an average of one pack of cigarettes a day for 1 year.  Yearly screening should continue until it has been 15 years since you quit.  Yearly screening should stop if you develop a health problem that would prevent you from having lung cancer treatment.  Breast Cancer  Practice breast self-awareness. This means understanding how your breasts normally appear and feel.  It also means doing regular breast self-exams. Let your health care provider know about any changes, no matter how small.  If you are in your 20s or 30s, you should have a clinical breast exam (CBE) by a health care provider every 1-3 years as part of a regular health exam.  If you are 40 or older, have a CBE every year. Also consider having a breast X-ray (mammogram) every year.  If you have a family history of breast cancer, talk to your health care provider about genetic screening.  If you   are at high risk for breast cancer, talk to your health care provider about having an MRI and a mammogram every year.  Breast cancer gene (BRCA) assessment is recommended for women who have family members with BRCA-related cancers. BRCA-related cancers include:  Breast.  Ovarian.  Tubal.  Peritoneal cancers.  Results of the assessment will determine the need for genetic counseling and BRCA1 and BRCA2 testing. Cervical Cancer Your health care provider may recommend that you be screened regularly for cancer of the pelvic organs (ovaries, uterus, and  vagina). This screening involves a pelvic examination, including checking for microscopic changes to the surface of your cervix (Pap test). You may be encouraged to have this screening done every 3 years, beginning at age 21.  For women ages 30-65, health care providers may recommend pelvic exams and Pap testing every 3 years, or they may recommend the Pap and pelvic exam, combined with testing for human papilloma virus (HPV), every 5 years. Some types of HPV increase your risk of cervical cancer. Testing for HPV may also be done on women of any age with unclear Pap test results.  Other health care providers may not recommend any screening for nonpregnant women who are considered low risk for pelvic cancer and who do not have symptoms. Ask your health care provider if a screening pelvic exam is right for you.  If you have had past treatment for cervical cancer or a condition that could lead to cancer, you need Pap tests and screening for cancer for at least 20 years after your treatment. If Pap tests have been discontinued, your risk factors (such as having a new sexual partner) need to be reassessed to determine if screening should resume. Some women have medical problems that increase the chance of getting cervical cancer. In these cases, your health care provider may recommend more frequent screening and Pap tests. Colorectal Cancer  This type of cancer can be detected and often prevented.  Routine colorectal cancer screening usually begins at 48 years of age and continues through 48 years of age.  Your health care provider may recommend screening at an earlier age if you have risk factors for colon cancer.  Your health care provider may also recommend using home test kits to check for hidden blood in the stool.  A small camera at the end of a tube can be used to examine your colon directly (sigmoidoscopy or colonoscopy). This is done to check for the earliest forms of colorectal  cancer.  Routine screening usually begins at age 50.  Direct examination of the colon should be repeated every 5-10 years through 48 years of age. However, you may need to be screened more often if early forms of precancerous polyps or small growths are found. Skin Cancer  Check your skin from head to toe regularly.  Tell your health care provider about any new moles or changes in moles, especially if there is a change in a mole's shape or color.  Also tell your health care provider if you have a mole that is larger than the size of a pencil eraser.  Always use sunscreen. Apply sunscreen liberally and repeatedly throughout the day.  Protect yourself by wearing long sleeves, pants, a wide-brimmed hat, and sunglasses whenever you are outside. HEART DISEASE, DIABETES, AND HIGH BLOOD PRESSURE   High blood pressure causes heart disease and increases the risk of stroke. High blood pressure is more likely to develop in:  People who have blood pressure in the high end   of the normal range (130-139/85-89 mm Hg).  People who are overweight or obese.  People who are African American.  If you are 38-23 years of age, have your blood pressure checked every 3-5 years. If you are 61 years of age or older, have your blood pressure checked every year. You should have your blood pressure measured twice--once when you are at a hospital or clinic, and once when you are not at a hospital or clinic. Record the average of the two measurements. To check your blood pressure when you are not at a hospital or clinic, you can use:  An automated blood pressure machine at a pharmacy.  A home blood pressure monitor.  If you are between 45 years and 39 years old, ask your health care provider if you should take aspirin to prevent strokes.  Have regular diabetes screenings. This involves taking a blood sample to check your fasting blood sugar level.  If you are at a normal weight and have a low risk for diabetes,  have this test once every three years after 48 years of age.  If you are overweight and have a high risk for diabetes, consider being tested at a younger age or more often. PREVENTING INFECTION  Hepatitis B  If you have a higher risk for hepatitis B, you should be screened for this virus. You are considered at high risk for hepatitis B if:  You were born in a country where hepatitis B is common. Ask your health care provider which countries are considered high risk.  Your parents were born in a high-risk country, and you have not been immunized against hepatitis B (hepatitis B vaccine).  You have HIV or AIDS.  You use needles to inject street drugs.  You live with someone who has hepatitis B.  You have had sex with someone who has hepatitis B.  You get hemodialysis treatment.  You take certain medicines for conditions, including cancer, organ transplantation, and autoimmune conditions. Hepatitis C  Blood testing is recommended for:  Everyone born from [birth year] through 1963.  Anyone with known risk factors for hepatitis C. Sexually transmitted infections (STIs)  You should be screened for sexually transmitted infections (STIs) including gonorrhea and chlamydia if:  You are sexually active and are younger than 48 years of age.  You are older than 48 years of age and your health care provider tells you that you are at risk for this type of infection.  Your sexual activity has changed since you were last screened and you are at an increased risk for chlamydia or gonorrhea. Ask your health care provider if you are at risk.  If you do not have HIV, but are at risk, it may be recommended that you take a prescription medicine daily to prevent HIV infection. This is called pre-exposure prophylaxis (PrEP). You are considered at risk if:  You are sexually active and do not regularly use condoms or know the HIV status of your partner(s).  You take drugs by injection.  You are sexually  active with a partner who has HIV. Talk with your health care provider about whether you are at high risk of being infected with HIV. If you choose to begin PrEP, you should first be tested for HIV. You should then be tested every 3 months for as long as you are taking PrEP.  PREGNANCY   If you are premenopausal and you may become pregnant, ask your health care provider about preconception counseling.  If you may  are considered at risk if:    You are sexually active and do not regularly use condoms or know the HIV status of your partner(s).    You take drugs by injection.    You are sexually  active with a partner who has HIV.  Talk with your health care provider about whether you are at high risk of being infected with HIV. If you choose to begin PrEP, you should first be tested for HIV. You should then be tested every 3 months for as long as you are taking PrEP.   PREGNANCY    If you are premenopausal and you may become pregnant, ask your health care provider about preconception counseling.   If you may become pregnant, take 400 to 800 micrograms (mcg) of folic acid every day.   If you want to prevent pregnancy, talk to your health care provider about birth control (contraception).  OSTEOPOROSIS AND MENOPAUSE    Osteoporosis is a disease in which the bones lose minerals and strength with aging. This can result in serious bone fractures. Your risk for osteoporosis can be identified using a bone density scan.   If you are 65 years of age or older, or if you are at risk for osteoporosis and fractures, ask your health care provider if you should be screened.   Ask your health care provider whether you should take a calcium or vitamin D supplement to lower your risk for osteoporosis.   Menopause may have certain physical symptoms and risks.   Hormone replacement therapy may reduce some of these symptoms and risks.  Talk to your health care provider about whether hormone replacement therapy is right for you.   HOME CARE INSTRUCTIONS    Schedule regular health, dental, and eye exams.   Stay current with your immunizations.    Do not use any tobacco products including cigarettes, chewing tobacco, or electronic cigarettes.   If you are pregnant, do not drink alcohol.   If you are breastfeeding, limit how much and how often you drink alcohol.   Limit alcohol intake to no more than 1 drink per day for nonpregnant women. One drink equals 12 ounces of beer, 5 ounces of wine, or 1 ounces of hard liquor.   Do not use street drugs.   Do not share needles.   Ask your health care provider for help if  you need support or information about quitting drugs.   Tell your health care provider if you often feel depressed.   Tell your health care provider if you have ever been abused or do not feel safe at home.     This information is not intended to replace advice given to you by your health care provider. Make sure you discuss any questions you have with your health care provider.     Document Released: 11/17/2010 Document Revised: 05/25/2014 Document Reviewed: 04/05/2013  [employer] Interactive Patient Education 2016 [employer]  Preventive Care for Adults, Female  A healthy lifestyle and preventive care can promote health and wellness. Preventive health guidelines for women include the following key practices.   A routine yearly physical is a good way to check with your health care provider about your health and preventive screening. It is a chance to share any concerns and updates on your health and to receive a thorough exam.   Visit your dentist for a routine exam and preventive care every 6 months. Brush your teeth twice a day and floss once a day. Good oral   hygiene prevents tooth decay and gum disease.   The frequency of eye exams is based on your age, health, family medical history, use of contact lenses, and other factors. Follow your health care provider's recommendations for frequency of eye exams.   Eat a healthy diet. Foods like vegetables, fruits, whole grains, low-fat dairy products, and lean protein foods contain the nutrients you need without too many calories. Decrease your intake of foods high in solid fats, added sugars, and salt. Eat the right amount of calories for you.Get information about a proper diet from your health care provider, if necessary.   Regular physical exercise is one of the most important things you can do for your health. Most adults should get at least 150 minutes of moderate-intensity exercise (any activity that increases your heart rate and causes you to sweat) each  week. In addition, most adults need muscle-strengthening exercises on 2 or more days a week.   Maintain a healthy weight. The body mass index (BMI) is a screening tool to identify possible weight problems. It provides an estimate of body fat based on height and weight. Your health care provider can find your BMI and can help you achieve or maintain a healthy weight.For adults 20 years and older:    A BMI below 18.5 is considered underweight.    A BMI of 18.5 to 24.9 is normal.    A BMI of 25 to 29.9 is considered overweight.    A BMI of 30 and above is considered obese.   Maintain normal blood lipids and cholesterol levels by exercising and minimizing your intake of saturated fat. Eat a balanced diet with plenty of fruit and vegetables. Blood tests for lipids and cholesterol should begin at age 20 and be repeated every 5 years. If your lipid or cholesterol levels are high, you are over 50, or you are at high risk for heart disease, you may need your cholesterol levels checked more frequently.Ongoing high lipid and cholesterol levels should be treated with medicines if diet and exercise are not working.   If you smoke, find out from your health care provider how to quit. If you do not use tobacco, do not start.   Lung cancer screening is recommended for adults aged 55-80 years who are at high risk for developing lung cancer because of a history of smoking. A yearly low-dose CT scan of the lungs is recommended for people who have at least a 30-pack-year history of smoking and are a current smoker or have quit within the past 15 years. A pack year of smoking is smoking an average of 1 pack of cigarettes a day for 1 year (for example: 1 pack a day for 30 years or 2 packs a day for 15 years). Yearly screening should continue until the smoker has stopped smoking for at least 15 years. Yearly screening should be stopped for people who develop a health problem that would prevent them from having lung cancer  treatment.   If you are pregnant, do not drink alcohol. If you are breastfeeding, be very cautious about drinking alcohol. If you are not pregnant and choose to drink alcohol, do not have more than 1 drink per day. One drink is considered to be 12 ounces (355 mL) of beer, 5 ounces (148 mL) of wine, or 1.5 ounces (44 mL) of liquor.   Avoid use of street drugs. Do not share needles with anyone. Ask for help if you need support or instructions about stopping the use   of drugs.   High blood pressure causes heart disease and increases the risk of stroke. Your blood pressure should be checked at least every 1 to 2 years. Ongoing high blood pressure should be treated with medicines if weight loss and exercise do not work.   If you are 55-79 years old, ask your health care provider if you should take aspirin to prevent strokes.   Diabetes screening is done by taking a blood sample to check your blood glucose level after you have not eaten for a certain period of time (fasting). If you are not overweight and you do not have risk factors for diabetes, you should be screened once every 3 years starting at age 45. If you are overweight or obese and you are 40-70 years of age, you should be screened for diabetes every year as part of your cardiovascular risk assessment.   Breast cancer screening is essential preventive care for women. You should practice "breast self-awareness." This means understanding the normal appearance and feel of your breasts and may include breast self-examination. Any changes detected, no matter how small, should be reported to a health care provider. Women in their 20s and 30s should have a clinical breast exam (CBE) by a health care provider as part of a regular health exam every 1 to 3 years. After age 40, women should have a CBE every year. Starting at age 40, women should consider having a mammogram (breast X-ray test) every year. Women who have a family history of breast cancer should talk to  their health care provider about genetic screening. Women at a high risk of breast cancer should talk to their health care providers about having an MRI and a mammogram every year.   Breast cancer gene (BRCA)-related cancer risk assessment is recommended for women who have family members with BRCA-related cancers. BRCA-related cancers include breast, ovarian, tubal, and peritoneal cancers. Having family members with these cancers may be associated with an increased risk for harmful changes (mutations) in the breast cancer genes BRCA1 and BRCA2. Results of the assessment will determine the need for genetic counseling and BRCA1 and BRCA2 testing.   Your health care provider may recommend that you be screened regularly for cancer of the pelvic organs (ovaries, uterus, and vagina). This screening involves a pelvic examination, including checking for microscopic changes to the surface of your cervix (Pap test). You may be encouraged to have this screening done every 3 years, beginning at age 21.    For women ages 30-65, health care providers may recommend pelvic exams and Pap testing every 3 years, or they may recommend the Pap and pelvic exam, combined with testing for human papilloma virus (HPV), every 5 years. Some types of HPV increase your risk of cervical cancer. Testing for HPV may also be done on women of any age with unclear Pap test results.    Other health care providers may not recommend any screening for nonpregnant women who are considered low risk for pelvic cancer and who do not have symptoms. Ask your health care provider if a screening pelvic exam is right for you.   If you have had past treatment for cervical cancer or a condition that could lead to cancer, you need Pap tests and screening for cancer for at least 20 years after your treatment. If Pap tests have been discontinued, your risk factors (such as having a new sexual partner) need to be reassessed to determine if screening should resume.  Some women have medical   problems that increase the chance of getting cervical cancer. In these cases, your health care provider may recommend more frequent screening and Pap tests.   Colorectal cancer can be detected and often prevented. Most routine colorectal cancer screening begins at the age of 50 years and continues through age 75 years. However, your health care provider may recommend screening at an earlier age if you have risk factors for colon cancer. On a yearly basis, your health care provider may provide home test kits to check for hidden blood in the stool. Use of a small camera at the end of a tube, to directly examine the colon (sigmoidoscopy or colonoscopy), can detect the earliest forms of colorectal cancer. Talk to your health care provider about this at age 50, when routine screening begins. Direct exam of the colon should be repeated every 5-10 years through age 75 years, unless early forms of precancerous polyps or small growths are found.   People who are at an increased risk for hepatitis B should be screened for this virus. You are considered at high risk for hepatitis B if:    You were born in a country where hepatitis B occurs often. Talk with your health care provider about which countries are considered high risk.    Your parents were born in a high-risk country and you have not received a shot to protect against hepatitis B (hepatitis B vaccine).    You have HIV or AIDS.    You use needles to inject street drugs.    You live with, or have sex with, someone who has hepatitis B.    You get hemodialysis treatment.    You take certain medicines for conditions like cancer, organ transplantation, and autoimmune conditions.   Hepatitis C blood testing is recommended for all people born from 1945 through 1965 and any individual with known risks for hepatitis C.   Practice safe sex. Use condoms and avoid high-risk sexual practices to reduce the spread of sexually transmitted infections  (STIs). STIs include gonorrhea, chlamydia, syphilis, trichomonas, herpes, HPV, and human immunodeficiency virus (HIV). Herpes, HIV, and HPV are viral illnesses that have no cure. They can result in disability, cancer, and death.   You should be screened for sexually transmitted illnesses (STIs) including gonorrhea and chlamydia if:    You are sexually active and are younger than 24 years.    You are older than 24 years and your health care provider tells you that you are at risk for this type of infection.    Your sexual activity has changed since you were last screened and you are at an increased risk for chlamydia or gonorrhea. Ask your health care provider if you are at risk.   If you are at risk of being infected with HIV, it is recommended that you take a prescription medicine daily to prevent HIV infection. This is called preexposure prophylaxis (PrEP). You are considered at risk if:    You are sexually active and do not regularly use condoms or know the HIV status of your partner(s).    You take drugs by injection.    You are sexually active with a partner who has HIV.    Talk with your health care provider about whether you are at high risk of being infected with HIV. If you choose to begin PrEP, you should first be tested for HIV. You should then be tested every 3 months for as long as you are taking PrEP.   Osteoporosis is   a disease in which the bones lose minerals and strength with aging. This can result in serious bone fractures or breaks. The risk of osteoporosis can be identified using a bone density scan. Women ages 65 years and over and women at risk for fractures or osteoporosis should discuss screening with their health care providers. Ask your health care provider whether you should take a calcium supplement or vitamin D to reduce the rate of osteoporosis.   Menopause can be associated with physical symptoms and risks. Hormone replacement therapy is available to decrease symptoms and risks.  You should talk to your health care provider about whether hormone replacement therapy is right for you.   Use sunscreen. Apply sunscreen liberally and repeatedly throughout the day. You should seek shade when your shadow is shorter than you. Protect yourself by wearing long sleeves, pants, a wide-brimmed hat, and sunglasses year round, whenever you are outdoors.   Once a month, do a whole body skin exam, using a mirror to look at the skin on your back. Tell your health care provider of new moles, moles that have irregular borders, moles that are larger than a pencil eraser, or moles that have changed in shape or color.   Stay current with required vaccines (immunizations).    Influenza vaccine. All adults should be immunized every year.    Tetanus, diphtheria, and acellular pertussis (Td, Tdap) vaccine. Pregnant women should receive 1 dose of Tdap vaccine during each pregnancy. The dose should be obtained regardless of the length of time since the last dose. Immunization is preferred during the 27th-36th week of gestation. An adult who has not previously received Tdap or who does not know her vaccine status should receive 1 dose of Tdap. This initial dose should be followed by tetanus and diphtheria toxoids (Td) booster doses every 10 years. Adults with an unknown or incomplete history of completing a 3-dose immunization series with Td-containing vaccines should begin or complete a primary immunization series including a Tdap dose. Adults should receive a Td booster every 10 years.    Varicella vaccine. An adult without evidence of immunity to varicella should receive 2 doses or a second dose if she has previously received 1 dose. Pregnant females who do not have evidence of immunity should receive the first dose after pregnancy. This first dose should be obtained before leaving the health care facility. The second dose should be obtained 4-8 weeks after the first dose.    Human papillomavirus (HPV) vaccine.  Females aged 13-26 years who have not received the vaccine previously should obtain the 3-dose series. The vaccine is not recommended for use in pregnant females. However, pregnancy testing is not needed before receiving a dose. If a female is found to be pregnant after receiving a dose, no treatment is needed. In that case, the remaining doses should be delayed until after the pregnancy. Immunization is recommended for any person with an immunocompromised condition through the age of 26 years if she did not get any or all doses earlier. During the 3-dose series, the second dose should be obtained 4-8 weeks after the first dose. The third dose should be obtained 24 weeks after the first dose and 16 weeks after the second dose.    Zoster vaccine. One dose is recommended for adults aged 60 years or older unless certain conditions are present.    Measles, mumps, and rubella (MMR) vaccine. Adults born before 1957 generally are considered immune to measles and mumps. Adults born in 1957 or   later should have 1 or more doses of MMR vaccine unless there is a contraindication to the vaccine or there is laboratory evidence of immunity to each of the three diseases. A routine second dose of MMR vaccine should be obtained at least 28 days after the first dose for students attending postsecondary schools, health care workers, or international travelers. People who received inactivated measles vaccine or an unknown type of measles vaccine during 1963-1967 should receive 2 doses of MMR vaccine. People who received inactivated mumps vaccine or an unknown type of mumps vaccine before 1979 and are at high risk for mumps infection should consider immunization with 2 doses of MMR vaccine. For females of childbearing age, rubella immunity should be determined. If there is no evidence of immunity, females who are not pregnant should be vaccinated. If there is no evidence of immunity, females who are pregnant should delay immunization  until after pregnancy. Unvaccinated health care workers born before 1957 who lack laboratory evidence of measles, mumps, or rubella immunity or laboratory confirmation of disease should consider measles and mumps immunization with 2 doses of MMR vaccine or rubella immunization with 1 dose of MMR vaccine.    Pneumococcal 13-valent conjugate (PCV13) vaccine. When indicated, a person who is uncertain of his immunization history and has no record of immunization should receive the PCV13 vaccine. All adults 65 years of age and older should receive this vaccine. An adult aged 19 years or older who has certain medical conditions and has not been previously immunized should receive 1 dose of PCV13 vaccine. This PCV13 should be followed with a dose of pneumococcal polysaccharide (PPSV23) vaccine. Adults who are at high risk for pneumococcal disease should obtain the PPSV23 vaccine at least 8 weeks after the dose of PCV13 vaccine. Adults older than 48 years of age who have normal immune system function should obtain the PPSV23 vaccine dose at least 1 year after the dose of PCV13 vaccine.    Pneumococcal polysaccharide (PPSV23) vaccine. When PCV13 is also indicated, PCV13 should be obtained first. All adults aged 65 years and older should be immunized. An adult younger than age 65 years who has certain medical conditions should be immunized. Any person who resides in a nursing home or long-term care facility should be immunized. An adult smoker should be immunized. People with an immunocompromised condition and certain other conditions should receive both PCV13 and PPSV23 vaccines. People with human immunodeficiency virus (HIV) infection should be immunized as soon as possible after diagnosis. Immunization during chemotherapy or radiation therapy should be avoided. Routine use of PPSV23 vaccine is not recommended for American Indians, [place] Natives, or people younger than 65 years unless there are medical conditions that  require PPSV23 vaccine. When indicated, people who have unknown immunization and have no record of immunization should receive PPSV23 vaccine. One-time revaccination 5 years after the first dose of PPSV23 is recommended for people aged 19-64 years who have chronic kidney failure, nephrotic syndrome, asplenia, or immunocompromised conditions. People who received 1-2 doses of PPSV23 before age 65 years should receive another dose of PPSV23 vaccine at age 65 years or later if at least 5 years have passed since the previous dose. Doses of PPSV23 are not needed for people immunized with PPSV23 at or after age 65 years.    Meningococcal vaccine. Adults with asplenia or persistent complement component deficiencies should receive 2 doses of quadrivalent meningococcal conjugate (MenACWY-D) vaccine. The doses should be obtained at least 2 months apart. Microbiologists working with certain   meningococcal bacteria, military recruits, people at risk during an outbreak, and people who travel to or live in countries with a high rate of meningitis should be immunized. A first-year college student up through age 21 years who is living in a residence hall should receive a dose if she did not receive a dose on or after her 16th birthday. Adults who have certain high-risk conditions should receive one or more doses of vaccine.    Hepatitis A vaccine. Adults who wish to be protected from this disease, have certain high-risk conditions, work with hepatitis A-infected animals, work in hepatitis A research labs, or travel to or work in countries with a high rate of hepatitis A should be immunized. Adults who were previously unvaccinated and who anticipate close contact with an international adoptee during the first 60 days after arrival in the [place] from a country with a high rate of hepatitis A should be immunized.    Hepatitis B vaccine. Adults who wish to be protected from this disease, have certain high-risk conditions, may be  exposed to blood or other infectious body fluids, are household contacts or sex partners of hepatitis B positive people, are clients or workers in certain care facilities, or travel to or work in countries with a high rate of hepatitis B should be immunized.    Haemophilus influenzae type b (Hib) vaccine. A previously unvaccinated person with asplenia or sickle cell disease or having a scheduled splenectomy should receive 1 dose of Hib vaccine. Regardless of previous immunization, a recipient of a hematopoietic stem cell transplant should receive a 3-dose series 6-12 months after her successful transplant. Hib vaccine is not recommended for adults with HIV infection.  Preventive Services / Frequency  Ages 19 to 39 years   Blood pressure check.** / Every 3-5 years.   Lipid and cholesterol check.** / Every 5 years beginning at age 20.   Clinical breast exam.** / Every 3 years for women in their 20s and 30s.   BRCA-related cancer risk assessment.** / For women who have family members with a BRCA-related cancer (breast, ovarian, tubal, or peritoneal cancers).   Pap test.** / Every 2 years from ages 21 through 29. Every 3 years starting at age 30 through age 65 or 70 with a history of 3 consecutive normal Pap tests.   HPV screening.** / Every 3 years from ages 30 through ages 65 to 70 with a history of 3 consecutive normal Pap tests.   Hepatitis C blood test.** / For any individual with known risks for hepatitis C.   Skin self-exam. / Monthly.   Influenza vaccine. / Every year.   Tetanus, diphtheria, and acellular pertussis (Tdap, Td) vaccine.** / Consult your health care provider. Pregnant women should receive 1 dose of Tdap vaccine during each pregnancy. 1 dose of Td every 10 years.   Varicella vaccine.** / Consult your health care provider. Pregnant females who do not have evidence of immunity should receive the first dose after pregnancy.   HPV vaccine. / 3 doses over 6 months, if 26 and younger. The  vaccine is not recommended for use in pregnant females. However, pregnancy testing is not needed before receiving a dose.   Measles, mumps, rubella (MMR) vaccine.** / You need at least 1 dose of MMR if you were born in 1957 or later. You may also need a 2nd dose. For females of childbearing age, rubella immunity should be determined. If there is no evidence of immunity, females who are not pregnant   should be vaccinated. If there is no evidence of immunity, females who are pregnant should delay immunization until after pregnancy.   Pneumococcal 13-valent conjugate (PCV13) vaccine.** / Consult your health care provider.   Pneumococcal polysaccharide (PPSV23) vaccine.** / 1 to 2 doses if you smoke cigarettes or if you have certain conditions.   Meningococcal vaccine.** / 1 dose if you are age 19 to 21 years and a first-year college student living in a residence hall, or have one of several medical conditions, you need to get vaccinated against meningococcal disease. You may also need additional booster doses.   Hepatitis A vaccine.** / Consult your health care provider.   Hepatitis B vaccine.** / Consult your health care provider.   Haemophilus influenzae type b (Hib) vaccine.** / Consult your health care provider.  Ages 40 to 64 years   Blood pressure check.** / Every year.   Lipid and cholesterol check.** / Every 5 years beginning at age 20 years.   Lung cancer screening. / Every year if you are aged 55-80 years and have a 30-pack-year history of smoking and currently smoke or have quit within the past 15 years. Yearly screening is stopped once you have quit smoking for at least 15 years or develop a health problem that would prevent you from having lung cancer treatment.   Clinical breast exam.** / Every year after age 40 years.   BRCA-related cancer risk assessment.** / For women who have family members with a BRCA-related cancer (breast, ovarian, tubal, or peritoneal cancers).   Mammogram.** / Every  year beginning at age 40 years and continuing for as long as you are in good health. Consult with your health care provider.   Pap test.** / Every 3 years starting at age 30 years through age 65 or 70 years with a history of 3 consecutive normal Pap tests.   HPV screening.** / Every 3 years from ages 30 years through ages 65 to 70 years with a history of 3 consecutive normal Pap tests.   Fecal occult blood test (FOBT) of stool. / Every year beginning at age 50 years and continuing until age 75 years. You may not need to do this test if you get a colonoscopy every 10 years.   Flexible sigmoidoscopy or colonoscopy.** / Every 5 years for a flexible sigmoidoscopy or every 10 years for a colonoscopy beginning at age 50 years and continuing until age 75 years.   Hepatitis C blood test.** / For all people born from 1945 through 1965 and any individual with known risks for hepatitis C.   Skin self-exam. / Monthly.   Influenza vaccine. / Every year.   Tetanus, diphtheria, and acellular pertussis (Tdap/Td) vaccine.** / Consult your health care provider. Pregnant women should receive 1 dose of Tdap vaccine during each pregnancy. 1 dose of Td every 10 years.   Varicella vaccine.** / Consult your health care provider. Pregnant females who do not have evidence of immunity should receive the first dose after pregnancy.   Zoster vaccine.** / 1 dose for adults aged 60 years or older.   Measles, mumps, rubella (MMR) vaccine.** / You need at least 1 dose of MMR if you were born in 1957 or later. You may also need a second dose. For females of childbearing age, rubella immunity should be determined. If there is no evidence of immunity, females who are not pregnant should be vaccinated. If there is no evidence of immunity, females who are pregnant should delay immunization until after pregnancy.     Pneumococcal 13-valent conjugate (PCV13) vaccine.** / Consult your health care provider.   Pneumococcal polysaccharide (PPSV23)  vaccine.** / 1 to 2 doses if you smoke cigarettes or if you have certain conditions.   Meningococcal vaccine.** / Consult your health care provider.   Hepatitis A vaccine.** / Consult your health care provider.   Hepatitis B vaccine.** / Consult your health care provider.   Haemophilus influenzae type b (Hib) vaccine.** / Consult your health care provider.  Ages 65 years and over   Blood pressure check.** / Every year.   Lipid and cholesterol check.** / Every 5 years beginning at age 20 years.   Lung cancer screening. / Every year if you are aged 55-80 years and have a 30-pack-year history of smoking and currently smoke or have quit within the past 15 years. Yearly screening is stopped once you have quit smoking for at least 15 years or develop a health problem that would prevent you from having lung cancer treatment.   Clinical breast exam.** / Every year after age 40 years.   BRCA-related cancer risk assessment.** / For women who have family members with a BRCA-related cancer (breast, ovarian, tubal, or peritoneal cancers).   Mammogram.** / Every year beginning at age 40 years and continuing for as long as you are in good health. Consult with your health care provider.   Pap test.** / Every 3 years starting at age 30 years through age 65 or 70 years with 3 consecutive normal Pap tests. Testing can be stopped between 65 and 70 years with 3 consecutive normal Pap tests and no abnormal Pap or HPV tests in the past 10 years.   HPV screening.** / Every 3 years from ages 30 years through ages 65 or 70 years with a history of 3 consecutive normal Pap tests. Testing can be stopped between 65 and 70 years with 3 consecutive normal Pap tests and no abnormal Pap or HPV tests in the past 10 years.   Fecal occult blood test (FOBT) of stool. / Every year beginning at age 50 years and continuing until age 75 years. You may not need to do this test if you get a colonoscopy every 10 years.   Flexible sigmoidoscopy  or colonoscopy.** / Every 5 years for a flexible sigmoidoscopy or every 10 years for a colonoscopy beginning at age 50 years and continuing until age 75 years.   Hepatitis C blood test.** / For all people born from 1945 through 1965 and any individual with known risks for hepatitis C.   Osteoporosis screening.** / A one-time screening for women ages 65 years and over and women at risk for fractures or osteoporosis.   Skin self-exam. / Monthly.   Influenza vaccine. / Every year.   Tetanus, diphtheria, and acellular pertussis (Tdap/Td) vaccine.** / 1 dose of Td every 10 years.   Varicella vaccine.** / Consult your health care provider.   Zoster vaccine.** / 1 dose for adults aged 60 years or older.   Pneumococcal 13-valent conjugate (PCV13) vaccine.** / Consult your health care provider.   Pneumococcal polysaccharide (PPSV23) vaccine.** / 1 dose for all adults aged 65 years and older.   Meningococcal vaccine.** / Consult your health care provider.   Hepatitis A vaccine.** / Consult your health care provider.   Hepatitis B vaccine.** / Consult your health care provider.   Haemophilus influenzae type b (Hib) vaccine.** / Consult your health care provider.  ** Family history and personal history of risk and conditions may change your

## 2016-01-10 LAB — CYTOLOGY - PAP

## 2016-01-12 DIAGNOSIS — Z8742 Personal history of other diseases of the female genital tract: Secondary | ICD-10-CM | POA: Insufficient documentation

## 2016-01-12 DIAGNOSIS — Z803 Family history of malignant neoplasm of breast: Secondary | ICD-10-CM | POA: Insufficient documentation

## 2016-01-17 ENCOUNTER — Telehealth: Payer: Self-pay

## 2016-01-17 NOTE — Telephone Encounter (Signed)
-----   Message from Herold HarmsMartin A Defrancesco, MD sent at 01/15/2016  7:42 AM EDT ----- Please notify - Abnormal Labs Schedule colposcopy Dr. Valentino Saxonherry in 4-6 weeks for ASCUS/positive Pap smear

## 2016-01-17 NOTE — Telephone Encounter (Signed)
Called pt no answer, LM for pt to call back  

## 2016-01-21 NOTE — Telephone Encounter (Signed)
Pt informed via C. Miller of the need for HPV.

## 2016-02-09 ENCOUNTER — Encounter: Payer: Self-pay | Admitting: Obstetrics and Gynecology

## 2016-02-18 ENCOUNTER — Encounter: Payer: Self-pay | Admitting: Obstetrics and Gynecology

## 2016-06-17 ENCOUNTER — Other Ambulatory Visit: Payer: Self-pay | Admitting: Family Medicine

## 2016-06-17 NOTE — Telephone Encounter (Signed)
I don't see where I have seen her

## 2016-06-17 NOTE — Telephone Encounter (Signed)
Your patient 

## 2016-06-18 ENCOUNTER — Other Ambulatory Visit: Payer: Self-pay | Admitting: Family Medicine

## 2016-09-07 ENCOUNTER — Ambulatory Visit
Admission: RE | Admit: 2016-09-07 | Discharge: 2016-09-07 | Disposition: A | Discharge status: Home / Self Care | Payer: BC Managed Care – PPO | Source: Ambulatory Visit | Attending: Obstetrics and Gynecology | Admitting: Obstetrics and Gynecology

## 2016-09-07 DIAGNOSIS — Z1231 Encounter for screening mammogram for malignant neoplasm of breast: Secondary | ICD-10-CM | POA: Diagnosis present

## 2016-09-07 DIAGNOSIS — Z1239 Encounter for other screening for malignant neoplasm of breast: Secondary | ICD-10-CM

## 2016-10-01 ENCOUNTER — Ambulatory Visit (INDEPENDENT_AMBULATORY_CARE_PROVIDER_SITE_OTHER): Payer: Self-pay | Admitting: Obstetrics and Gynecology

## 2016-10-01 ENCOUNTER — Encounter: Payer: Self-pay | Admitting: Obstetrics and Gynecology

## 2016-10-01 VITALS — BP 122/79 | HR 90 | Ht 62.5 in | Wt 227.0 lb

## 2016-10-01 DIAGNOSIS — R87810 Cervical high risk human papillomavirus (HPV) DNA test positive: Secondary | ICD-10-CM

## 2016-10-01 DIAGNOSIS — R87610 Atypical squamous cells of undetermined significance on cytologic smear of cervix (ASC-US): Secondary | ICD-10-CM

## 2016-10-01 NOTE — Progress Notes (Signed)
    GYNECOLOGY CLINIC COLPOSCOPY PROCEDURE NOTE  51 y.o. [id number] here for colposcopy for ASCUS with POSITIVE high risk HPV pap smear on 12/2015. 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  Patient given informed consent, signed copy in the chart, time out was performed.  Placed in lithotomy position. Cervix viewed with speculum and colposcope after application of acetic acid.   Colposcopy adequate? Yes  acetowhite lesion(s) noted at 3 and 6 o'clock; corresponding biopsies obtained.  ECC specimen obtained. All specimens were labeled and sent to pathology.   Patient was given post procedure instructions.  Will follow up pathology and manage accordingly; patient will be contacted with results and recommendations.  Routine preventative health maintenance measures emphasized.    Hildred Laserherry, Elva Mauro, MD [facility]

## 2016-10-01 NOTE — Addendum Note (Signed)
Addended by: Frankey ShownGLANTON, Sevan Mcbroom C on: 10/01/2016 12:09 PM   Modules accepted: Orders

## 2016-10-05 LAB — PATHOLOGY

## 2017-01-14 ENCOUNTER — Encounter: Payer: Self-pay | Admitting: Obstetrics and Gynecology

## 2017-01-14 ENCOUNTER — Ambulatory Visit (INDEPENDENT_AMBULATORY_CARE_PROVIDER_SITE_OTHER): Payer: BC Managed Care – PPO | Admitting: Obstetrics and Gynecology

## 2017-01-14 VITALS — BP 126/76 | HR 80 | Ht 63.5 in | Wt 230.8 lb

## 2017-01-14 DIAGNOSIS — Z131 Encounter for screening for diabetes mellitus: Secondary | ICD-10-CM | POA: Diagnosis not present

## 2017-01-14 DIAGNOSIS — Z13220 Encounter for screening for lipoid disorders: Secondary | ICD-10-CM | POA: Diagnosis not present

## 2017-01-14 DIAGNOSIS — Z87898 Personal history of other specified conditions: Secondary | ICD-10-CM

## 2017-01-14 DIAGNOSIS — Z01419 Encounter for gynecological examination (general) (routine) without abnormal findings: Secondary | ICD-10-CM

## 2017-01-14 DIAGNOSIS — Z803 Family history of malignant neoplasm of breast: Secondary | ICD-10-CM | POA: Diagnosis not present

## 2017-01-14 DIAGNOSIS — Z8742 Personal history of other diseases of the female genital tract: Secondary | ICD-10-CM

## 2017-01-14 MED ORDER — LEVONORGESTREL-ETHINYL ESTRAD 0.1-20 MG-MCG PO TABS: 1.0000 | ORAL_TABLET | Freq: Every day | ORAL | 11 refills | Status: DC

## 2017-01-14 NOTE — Patient Instructions (Signed)
Calorie Counting for Weight Loss Calories are units of energy. Your body needs a certain amount of calories from food to keep you going throughout the day. When you eat more calories than your body needs, your body stores the extra calories as fat. When you eat fewer calories than your body needs, your body burns fat to get the energy it needs. Calorie counting means keeping track of how many calories you eat and drink each day. Calorie counting can be helpful if you need to lose weight. If you make sure to eat fewer calories than your body needs, you should lose weight. Ask your health care provider what a healthy weight is for you. For calorie counting to work, you will need to eat the right number of calories in a day in order to lose a healthy amount of weight per week. A dietitian can help you determine how many calories you need in a day and will give you suggestions on how to reach your calorie goal.  A healthy amount of weight to lose per week is usually 1-2 lb (0.5-0.9 kg). This usually means that your daily calorie intake should be reduced by 500-750 calories.  Eating 1,200 - 1,500 calories per day can help most women lose weight.  Eating 1,500 - 1,800 calories per day can help most men lose weight.  What is my plan? My goal is to have __________ calories per day. If I have this many calories per day, I should lose around __________ pounds per week. What do I need to know about calorie counting? In order to meet your daily calorie goal, you will need to:  Find out how many calories are in each food you would like to eat. Try to do this before you eat.  Decide how much of the food you plan to eat.  Write down what you ate and how many calories it had. Doing this is called keeping a food log.  To successfully lose weight, it is important to balance calorie counting with a healthy lifestyle that includes regular activity. Aim for 150 minutes of moderate exercise (such as walking) or 75  minutes of vigorous exercise (such as running) each week. Where do I find calorie information?  The number of calories in a food can be found on a Nutrition Facts label. If a food does not have a Nutrition Facts label, try to look up the calories online or ask your dietitian for help. Remember that calories are listed per serving. If you choose to have more than one serving of a food, you will have to multiply the calories per serving by the amount of servings you plan to eat. For example, the label on a package of bread might say that a serving size is 1 slice and that there are 90 calories in a serving. If you eat 1 slice, you will have eaten 90 calories. If you eat 2 slices, you will have eaten 180 calories. How do I keep a food log? Immediately after each meal, record the following information in your food log:  What you ate. Don't forget to include toppings, sauces, and other extras on the food.  How much you ate. This can be measured in cups, ounces, or number of items.  How many calories each food and drink had.  The total number of calories in the meal.  Keep your food log near you, such as in a small notebook in your pocket, or use a mobile app or website. Some   programs will calculate calories for you and show you how many calories you have left for the day to meet your goal. What are some calorie counting tips?  Use your calories on foods and drinks that will fill you up and not leave you hungry: ? Some examples of foods that fill you up are nuts and nut butters, vegetables, lean proteins, and high-fiber foods like whole grains. High-fiber foods are foods with more than 5 g fiber per serving. ? Drinks such as sodas, specialty coffee drinks, alcohol, and juices have a lot of calories, yet do not fill you up.  Eat nutritious foods and avoid empty calories. Empty calories are calories you get from foods or beverages that do not have many vitamins or protein, such as candy, sweets, and  soda. It is better to have a nutritious high-calorie food (such as an avocado) than a food with few nutrients (such as a bag of chips).  Know how many calories are in the foods you eat most often. This will help you calculate calorie counts faster.  Pay attention to calories in drinks. Low-calorie drinks include water and unsweetened drinks.  Pay attention to nutrition labels for "low fat" or "fat free" foods. These foods sometimes have the same amount of calories or more calories than the full fat versions. They also often have added sugar, starch, or salt, to make up for flavor that was removed with the fat.  Find a way of tracking calories that works for you. Get creative. Try different apps or programs if writing down calories does not work for you. What are some portion control tips?  Know how many calories are in a serving. This will help you know how many servings of a certain food you can have.  Use a measuring cup to measure serving sizes. You could also try weighing out portions on a kitchen scale. With time, you will be able to estimate serving sizes for some foods.  Take some time to put servings of different foods on your favorite plates, bowls, and cups so you know what a serving looks like.  Try not to eat straight from a bag or box. Doing this can lead to overeating. Put the amount you would like to eat in a cup or on a plate to make sure you are eating the right portion.  Use smaller plates, glasses, and bowls to prevent overeating.  Try not to multitask (for example, watch TV or use your computer) while eating. If it is time to eat, sit down at a table and enjoy your food. This will help you to know when you are full. It will also help you to be aware of what you are eating and how much you are eating. What are tips for following this plan? Reading food labels  Check the calorie count compared to the serving size. The serving size may be smaller than what you are used to  eating.  Check the source of the calories. Make sure the food you are eating is high in vitamins and protein and low in saturated and trans fats. Shopping  Read nutrition labels while you shop. This will help you make healthy decisions before you decide to purchase your food.  Make a grocery list and stick to it. Cooking  Try to cook your favorite foods in a healthier way. For example, try baking instead of frying.  Use low-fat dairy products. Meal planning  Use more fruits and vegetables. Half of your plate should   be fruits and vegetables.  Include lean proteins like poultry and fish. How do I count calories when eating out?  Ask for smaller portion sizes.  Consider sharing an entree and sides instead of getting your own entree.  If you get your own entree, eat only half. Ask for a box at the beginning of your meal and put the rest of your entree in it so you are not tempted to eat it.  If calories are listed on the menu, choose the lower calorie options.  Choose dishes that include vegetables, fruits, whole grains, low-fat dairy products, and lean protein.  Choose items that are boiled, broiled, grilled, or steamed. Stay away from items that are buttered, battered, fried, or served with cream sauce. Items labeled "crispy" are usually fried, unless stated otherwise.  Choose water, low-fat milk, unsweetened iced tea, or other drinks without added sugar. If you want an alcoholic beverage, choose a lower calorie option such as a glass of wine or light beer.  Ask for dressings, sauces, and syrups on the side. These are usually high in calories, so you should limit the amount you eat.  If you want a salad, choose a garden salad and ask for grilled meats. Avoid extra toppings like bacon, cheese, or fried items. Ask for the dressing on the side, or ask for olive oil and vinegar or lemon to use as dressing.  Estimate how many servings of a food you are given. For example, a serving of  cooked rice is  cup or about the size of half a baseball. Knowing serving sizes will help you be aware of how much food you are eating at restaurants. The list below tells you how big or small some common portion sizes are based on everyday objects: ? 1 oz-4 stacked dice. ? 3 oz-1 deck of cards. ? 1 tsp-1 die. ? 1 Tbsp- a ping-pong ball. ? 2 Tbsp-1 ping-pong ball. ?  cup- baseball. ? 1 cup-1 baseball. Summary  Calorie counting means keeping track of how many calories you eat and drink each day. If you eat fewer calories than your body needs, you should lose weight.  A healthy amount of weight to lose per week is usually 1-2 lb (0.5-0.9 kg). This usually means reducing your daily calorie intake by 500-750 calories.  The number of calories in a food can be found on a Nutrition Facts label. If a food does not have a Nutrition Facts label, try to look up the calories online or ask your dietitian for help.  Use your calories on foods and drinks that will fill you up, and not on foods and drinks that will leave you hungry.  Use smaller plates, glasses, and bowls to prevent overeating. This information is not intended to replace advice given to you by your health care provider. Make sure you discuss any questions you have with your health care provider. Document Released: 05/04/2005 Document Revised: 04/03/2016 Document Reviewed: 04/03/2016 Elsevier Interactive Patient Education  2017 [employer] Exercising to Lose Weight Exercising can help you to lose weight. In order to lose weight through exercise, you need to do vigorous-intensity exercise. You can tell that you are exercising with vigorous intensity if you are breathing very hard and fast and cannot hold a conversation while exercising. Moderate-intensity exercise helps to maintain your current weight. You can tell that you are exercising at a moderate level if you have a higher heart rate and faster breathing, but you are still able to  hold   a conversation. How often should I exercise? Choose an activity that you enjoy and set realistic goals. Your health care provider can help you to make an activity plan that works for you. Exercise regularly as directed by your health care provider. This may include:  Doing resistance training twice each week, such as: ? Push-ups. ? Sit-ups. ? Lifting weights. ? Using resistance bands.  Doing a given intensity of exercise for a given amount of time. Choose from these options: ? 150 minutes of moderate-intensity exercise every week. ? 75 minutes of vigorous-intensity exercise every week. ? A mix of moderate-intensity and vigorous-intensity exercise every week.  Children, pregnant women, people who are out of shape, people who are overweight, and older adults may need to consult a health care provider for individual recommendations. If you have any sort of medical condition, be sure to consult your health care provider before starting a new exercise program. What are some activities that can help me to lose weight?  Walking at a rate of at least 4.5 miles an hour.  Jogging or running at a rate of 5 miles per hour.  Biking at a rate of at least 10 miles per hour.  Lap swimming.  Roller-skating or in-line skating.  Cross-country skiing.  Vigorous competitive sports, such as football, basketball, and soccer.  Jumping rope.  Aerobic dancing. How can I be more active in my day-to-day activities?  Use the stairs instead of the elevator.  Take a walk during your lunch break.  If you drive, park your car farther away from work or school.  If you take public transportation, get off one stop early and walk the rest of the way.  Make all of your phone calls while standing up and walking around.  Get up, stretch, and walk around every 30 minutes throughout the day. What guidelines should I follow while exercising?  Do not exercise so much that you hurt yourself, feel dizzy, or  get very short of breath.  Consult your health care provider prior to starting a new exercise program.  Wear comfortable clothes and shoes with good support.  Drink plenty of water while you exercise to prevent dehydration or heat stroke. Body water is lost during exercise and must be replaced.  Work out until you breathe faster and your heart beats faster. This information is not intended to replace advice given to you by your health care provider. Make sure you discuss any questions you have with your health care provider. Document Released: 06/06/2010 Document Revised: 10/10/2015 Document Reviewed: 10/05/2013 Elsevier Interactive Patient Education  [employer].

## 2017-01-14 NOTE — Progress Notes (Signed)
GYNECOLOGY ANNUAL PHYSICAL EXAM PROGRESS NOTE  Subjective:    Hannah Chang is a 51 y.o. G79P2002 single female who presents for an annual exam.  The patient is not currently sexually active. The patient wears seatbelts: yes. The patient participates in regular exercise: yes. Has the patient ever been transfused or tattooed?: no. The patient reports that there is not domestic violence in her life.    The patient has the following complaints today: 1. Patient notes that she is having difficulty with weight loss.  States that she has been trying for the past month.  Has been joined [employer] as she has had success in the past, but is not noting any difference as of yet.  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  Notes that this will settle down over the next month and she will begin to try to incorporate exercise). Would also like to discuss other methods for assisting in meeting her weight loss goal.    Gynecologic History No LMP recorded. Patient is not currently having periods (Reason: Oral contraceptives). Menstrual History: OB History    Gravida Para Term Preterm AB Living   2 2 2     2    SAB TAB Ectopic Multiple Live Births           2      Menarche age: 39 No LMP recorded. Patient is not currently having periods (Reason: Oral contraceptives).    Contraception: OCP (estrogen/progesterone) History of STI's: Denies Last Pap: 12/2015. 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   Last mammogram: 08/2016. Results were: negative   Obstetric History   G2   P2   T2   P0   A0   L2    SAB0   TAB0   Ectopic0   Multiple0   Live Births2     # Outcome Date GA Lbr Len/2nd Weight Sex Delivery Anes PTL Lv  2 Term     M Vag-Spont   LIV  1 Term     F CS-Unspec   LIV      Past Medical History:  Diagnosis Date  . Environmental allergies    . Exercise-induced asthma   . Migraines   . Shingles     Past Surgical History:  Procedure Laterality Date  . CESAREAN SECTION  1992  . EYE SURGERY    . NASAL SEPTUM SURGERY     X 2  . TONSILLECTOMY      Family History  Problem Relation Age of Onset  . Breast cancer Mother 64  . Breast cancer Maternal Aunt 35    Social History   Social History  . Marital status: Divorced    Spouse name: N/A  . Number of children: N/A  . Years of education: N/A   Occupational History  . Not on file.   Social History Main Topics  . Smoking status: Never Smoker  . Smokeless tobacco: Never Used  . Alcohol use Yes     Comment: ONCE A MONTH  . Drug use: No  . Sexual activity: Not Currently    Birth control/ protection: None, Pill   Other Topics Concern  . Not on file   Social History Narrative  . No narrative on file    Current Outpatient Prescriptions on File Prior to Visit  Medication Sig Dispense Refill  .  Albuterol Sulfate (PROAIR HFA IN) Inhale into the lungs as needed.    . fexofenadine (ALLEGRA) 180 MG tablet Take 180 mg by mouth as needed for allergies or rhinitis.    . fluticasone (FLONASE) 50 MCG/ACT nasal spray Place into both nostrils daily.    Marland Kitchen levalbuterol (XOPENEX HFA) 45 MCG/ACT inhaler Inhale into the lungs.    Marland Kitchen levonorgestrel-ethinyl estradiol (AVIANE) 0.1-20 MG-MCG tablet Take 1 tablet by mouth daily. TO BE TAKEN CONTINUOUSLY 1 Package 11  . valACYclovir (VALTREX) 1000 MG tablet Take 1 tablet (1,000 mg total) by mouth 3 (three) times daily. Take at first sign of rash 21 tablet 2   No current facility-administered medications on file prior to visit.     Allergies  Allergen Reactions  . Cephalexin Other (See Comments)    REACTION: whelps REACTION: whelps  . Doxycycline Hyclate     REACTION: as child  . Doxycycline Hyclate Other (See Comments)    REACTION: as child  . Erythromycin Other (See Comments)    REACTION: as child REACTION: as child  .  Sulfa Antibiotics Other (See Comments)    Because of the reaction she had with Keflex  Because of the reaction she had with Keflex  UNKNOWN     Review of Systems Constitutional: negative for chills, fatigue, fevers and sweats Eyes: negative for irritation, redness and visual disturbance Ears, nose, mouth, throat, and face: negative for hearing loss, nasal congestion, snoring and tinnitus Respiratory: negative for asthma, cough, sputum Cardiovascular: negative for chest pain, dyspnea, exertional chest pressure/discomfort, irregular heart beat, palpitations and syncope Gastrointestinal: negative for abdominal pain, change in bowel habits, nausea and vomiting Genitourinary: negative for abnormal menstrual periods, genital lesions, sexual problems and vaginal discharge, dysuria and urinary incontinence Integument/breast: negative for breast lump, breast tenderness and nipple discharge Hematologic/lymphatic: negative for bleeding and easy bruising Musculoskeletal:negative for back pain and muscle weakness Neurological: negative for dizziness, headaches, vertigo and weakness Endocrine: negative for diabetic symptoms including polydipsia, polyuria and skin dryness Allergic/Immunologic: negative for hay fever and urticaria       Objective:  Blood pressure 126/76, pulse 80, height 5' 3.5" (1.613 m), weight 230 lb 12.8 oz (104.7 kg). Body mass index is 40.24 kg/m.  General Appearance:    Alert, cooperative, no distress, appears stated age, morbidly obese  Head:    Normocephalic, without obvious abnormality, atraumatic  Eyes:    PERRL, conjunctiva/corneas clear, EOM's intact, both eyes  Ears:    Normal external ear canals, both ears  Nose:   Nares normal, septum midline, mucosa normal, no drainage or sinus tenderness  Throat:   Lips, mucosa, and tongue normal; teeth and gums normal  Neck:   Supple, symmetrical, trachea midline, no adenopathy; thyroid: no enlargement/tenderness/nodules; no  carotid bruit or JVD  Back:     Symmetric, no curvature, ROM normal, no CVA tenderness  Lungs:     Clear to auscultation bilaterally, respirations unlabored  Chest Wall:    No tenderness or deformity   Heart:    Regular rate and rhythm, S1 and S2 normal, no murmur, rub or gallop  Breast Exam:    No tenderness, masses, or nipple abnormality  Abdomen:     Soft, non-tender, bowel sounds active all four quadrants, no masses, no organomegaly.    Genitalia:    Pelvic:external genitalia normal, vagina without lesions, discharge, or tenderness, rectovaginal septum  normal. Cervix normal in appearance, no cervical motion tenderness, no adnexal masses or tenderness.  Uterus normal size, shape, mobile,  regular contours, nontender.  Rectal:    Normal external sphincter.  No hemorrhoids appreciated. Internal exam not done.   Extremities:   Extremities normal, atraumatic, no cyanosis or edema  Pulses:   2+ and symmetric all extremities  Skin:   Skin color, texture, turgor normal, no rashes or lesions.  Multiple freckles present.   Lymph nodes:   Cervical, supraclavicular, and axillary nodes normal  Neurologic:   CNII-XII intact, normal strength, sensation and reflexes throughout   .  Labs:  Lab Results  Component Value Date   WBC 6.2 12/07/2006   HGB 12.2 12/07/2006   HCT 36.0 12/07/2006   MCV 95.5 12/07/2006   PLT 346 12/07/2006    Lab Results  Component Value Date   CREATININE 0.7 12/07/2006   BUN 6 12/07/2006   NA 134 (L) 12/07/2006   K 4.2 12/07/2006   CL 104 12/07/2006   CO2 25 12/07/2006    Lab Results  Component Value Date   ALT 18 12/07/2006   AST 17 12/07/2006   ALKPHOS 55 12/07/2006   BILITOT 0.6 12/07/2006    Lab Results  Component Value Date   TSH 1.41 12/07/2006     Assessment:   Routine annual gynecologic exam.  Morbid obesity H/o abnormal pap smear Family h/o breast cancer  Plan:    Blood tests ordered: CBC, CMP, TSH, Lipid panel.  Patient is fasting.    Breast self exam technique reviewed and patient encouraged to perform self-exam monthly. Contraception: OCP (estrogen/progesterone) and desires refill.  Refill given. Discussed healthy lifestyle modifications. Pap smear performed today for screening and h/o abnormal pap smear. Mammogram up to date.  Normal. Family h/o breast cancer (in mother, and now in aunt who was diagnosed this past year).  Discussion had again on genetic screening.  Patient now with insurance and will consider. Given handout.  [facility]. To f/u in 1 year for annual exam.     Hildred Laserherry, Preciosa Bundrick, MD [facility]

## 2017-01-15 LAB — CBC
HEMOGLOBIN: 13 g/dL (ref 11.1–15.9)
Hematocrit: 39.6 % (ref 34.0–46.6)
MCH: 31.9 pg (ref 26.6–33.0)
MCHC: 32.8 g/dL (ref 31.5–35.7)
MCV: 97 fL (ref 79–97)
Platelets: 321 10*3/uL (ref 150–379)
RBC: 4.08 x10E6/uL (ref 3.77–5.28)
RDW: 12.7 % (ref 12.3–15.4)
WBC: 7.6 10*3/uL (ref 3.4–10.8)

## 2017-01-15 LAB — COMPREHENSIVE METABOLIC PANEL
ALBUMIN: 4 g/dL (ref 3.5–5.5)
ALK PHOS: 81 IU/L (ref 39–117)
ALT: 20 IU/L (ref 0–32)
AST: 34 IU/L (ref 0–40)
Albumin/Globulin Ratio: 1.4 (ref 1.2–2.2)
BUN / CREAT RATIO: 12 (ref 9–23)
BUN: 9 mg/dL (ref 6–24)
Bilirubin Total: 0.3 mg/dL (ref 0.0–1.2)
CO2: 19 mmol/L — AB (ref 20–29)
CREATININE: 0.74 mg/dL (ref 0.57–1.00)
Calcium: 8.9 mg/dL (ref 8.7–10.2)
Chloride: 102 mmol/L (ref 96–106)
GFR calc Af Amer: 111 mL/min/{1.73_m2} (ref >59)
GFR calc non Af Amer: 96 mL/min/{1.73_m2} (ref >59)
GLUCOSE: 99 mg/dL (ref 65–99)
Globulin, Total: 2.8 g/dL (ref 1.5–4.5)
Potassium: 5.3 mmol/L — ABNORMAL HIGH (ref 3.5–5.2)
Sodium: 140 mmol/L (ref 134–144)
Total Protein: 6.8 g/dL (ref 6.0–8.5)

## 2017-01-15 LAB — LIPID PANEL
CHOL/HDL RATIO: 3 ratio (ref 0.0–4.4)
Cholesterol, Total: 157 mg/dL (ref 100–199)
HDL: 52 mg/dL (ref >39)
LDL Calculated: 90 mg/dL (ref 0–99)
Triglycerides: 75 mg/dL (ref 0–149)
VLDL Cholesterol Cal: 15 mg/dL (ref 5–40)

## 2017-01-15 LAB — TSH: TSH: 2.76 u[IU]/mL (ref 0.450–4.500)

## 2017-01-17 ENCOUNTER — Encounter: Payer: Self-pay | Admitting: Obstetrics and Gynecology

## 2017-01-19 ENCOUNTER — Other Ambulatory Visit: Payer: Self-pay

## 2017-01-19 DIAGNOSIS — Z3041 Encounter for surveillance of contraceptive pills: Secondary | ICD-10-CM

## 2017-01-19 MED ORDER — LEVONORGESTREL-ETHINYL ESTRAD 0.1-20 MG-MCG PO TABS: 1.0000 | ORAL_TABLET | Freq: Every day | ORAL | 11 refills | Status: DC

## 2017-01-25 ENCOUNTER — Telehealth: Payer: Self-pay

## 2017-01-25 LAB — PAP IG AND HPV HIGH-RISK
HPV, HIGH-RISK: POSITIVE — AB
PAP SMEAR COMMENT: 0

## 2017-01-25 NOTE — Telephone Encounter (Signed)
Called pt no answer. LM for pt informing her of the need for colpo, advised pt to call back to schedule.

## 2017-01-25 NOTE — Telephone Encounter (Signed)
-----   Message from Hildred LaserAnika Cherry, MD sent at 01/25/2017  1:28 PM EDT ----- Patient has another abnormal pap (LGSIL, although may be higher grade cells a well per note).  Please inform patient of need for colposcopy again. ,

## 2017-10-15 ENCOUNTER — Telehealth: Payer: Self-pay | Admitting: Obstetrics and Gynecology

## 2017-10-15 ENCOUNTER — Other Ambulatory Visit: Payer: Self-pay

## 2017-10-15 DIAGNOSIS — Z3041 Encounter for surveillance of contraceptive pills: Secondary | ICD-10-CM

## 2017-10-15 MED ORDER — LEVONORGESTREL-ETHINYL ESTRAD 0.1-20 MG-MCG PO TABS: 1.0000 | ORAL_TABLET | Freq: Every day | ORAL | 0 refills | Status: DC

## 2017-10-15 NOTE — Telephone Encounter (Signed)
The patient called @ 8:09 asking about her levonorgestrel-ethinyl estradiol (AVIANE) 0.1-20 MG-MCG tablet refill.  She said she starts a new pack on Sunday so she will need it before then.  Her last physical was 12/2016.  She still uses [facility] on [address]  Please call her at work up until noon today at [phone]; after noon today call her cell at [phone].  She has voicemail on both.  Please advise.  Thank you

## 2017-10-15 NOTE — Telephone Encounter (Signed)
Pt was called and left message informing her that her birth control had been refilled and sent to the [facility] on [address]

## 2017-12-15 ENCOUNTER — Telehealth: Payer: Self-pay | Admitting: Obstetrics and Gynecology

## 2017-12-15 ENCOUNTER — Other Ambulatory Visit: Payer: Self-pay

## 2017-12-15 DIAGNOSIS — Z3041 Encounter for surveillance of contraceptive pills: Secondary | ICD-10-CM

## 2017-12-15 MED ORDER — LEVONORGESTREL-ETHINYL ESTRAD 0.1-20 MG-MCG PO TABS: 1.0000 | ORAL_TABLET | Freq: Every day | ORAL | 0 refills | Status: DC

## 2017-12-15 NOTE — Telephone Encounter (Signed)
The patient has an upcoming annual exam, but she is stating she does not have enough BC to carry her until her appointment on 9/3 at 8 AM with Dr. Valentino Saxonherry.  She is needing a refill to do her if possible, please advise, thanks.

## 2017-12-16 NOTE — Telephone Encounter (Signed)
Pt called LM that her medication had been refilled to cover her until her appt.

## 2017-12-21 ENCOUNTER — Telehealth: Payer: Self-pay | Admitting: Obstetrics and Gynecology

## 2017-12-21 DIAGNOSIS — Z3041 Encounter for surveillance of contraceptive pills: Secondary | ICD-10-CM

## 2017-12-21 NOTE — Telephone Encounter (Signed)
The patient called and stated that she recently asked for a sample of pills but was only given one pack. The patient stated that she informed the nurse that she was going to need two packs due to her skipping the last week of pills. The patient would like a call back. Please advise.

## 2017-12-23 MED ORDER — LEVONORGESTREL-ETHINYL ESTRAD 0.1-20 MG-MCG PO TABS
1.0000 | ORAL_TABLET | Freq: Every day | ORAL | 0 refills | Status: DC
Start: 2017-12-23 — End: 2018-01-25

## 2017-12-23 NOTE — Addendum Note (Signed)
Addended by: Silvano BilisHAMPTON, Ellwood Steidle L on: 12/23/2017 11:59 AM   Modules accepted: Orders

## 2017-12-23 NOTE — Telephone Encounter (Signed)
Pt called no answer LM via voicemail to call the office to speak more about her medication.

## 2018-01-18 ENCOUNTER — Encounter: Payer: BC Managed Care – PPO | Admitting: Obstetrics and Gynecology

## 2018-01-24 ENCOUNTER — Telehealth: Payer: Self-pay | Admitting: Obstetrics and Gynecology

## 2018-01-24 DIAGNOSIS — Z3041 Encounter for surveillance of contraceptive pills: Secondary | ICD-10-CM

## 2018-01-24 NOTE — Telephone Encounter (Signed)
The patient had to reschedule her last appointment that was a NO SHOW due to her mother passing away.  She is now scheduled to come in on 9/17 at 8 AM; however, her Aviane will run out this Saturday so she is asking for a refill on this medication to get her through until her appointment.  She is using the same pharmacy.  Please advise, thanks.

## 2018-01-25 MED ORDER — LEVONORGESTREL-ETHINYL ESTRAD 0.1-20 MG-MCG PO TABS: 1.0000 | ORAL_TABLET | Freq: Every day | ORAL | 0 refills | Status: DC

## 2018-01-25 NOTE — Telephone Encounter (Signed)
Pt informed via voicemail that her medication has been refilled until her visit.

## 2018-02-01 ENCOUNTER — Ambulatory Visit (INDEPENDENT_AMBULATORY_CARE_PROVIDER_SITE_OTHER): Payer: BC Managed Care – PPO | Admitting: Obstetrics and Gynecology

## 2018-02-01 ENCOUNTER — Other Ambulatory Visit (HOSPITAL_COMMUNITY)
Admission: RE | Admit: 2018-02-01 | Discharge: 2018-02-01 | Disposition: A | Discharge status: Home / Self Care | Payer: BC Managed Care – PPO | Source: Ambulatory Visit | Attending: Obstetrics and Gynecology | Admitting: Obstetrics and Gynecology

## 2018-02-01 ENCOUNTER — Encounter: Payer: Self-pay | Admitting: Obstetrics and Gynecology

## 2018-02-01 VITALS — BP 120/78 | HR 89 | Ht 64.0 in | Wt 231.6 lb

## 2018-02-01 DIAGNOSIS — Z1231 Encounter for screening mammogram for malignant neoplasm of breast: Secondary | ICD-10-CM

## 2018-02-01 DIAGNOSIS — N870 Mild cervical dysplasia: Secondary | ICD-10-CM | POA: Insufficient documentation

## 2018-02-01 DIAGNOSIS — Z3041 Encounter for surveillance of contraceptive pills: Secondary | ICD-10-CM | POA: Diagnosis not present

## 2018-02-01 DIAGNOSIS — F4321 Adjustment disorder with depressed mood: Secondary | ICD-10-CM

## 2018-02-01 DIAGNOSIS — Z01419 Encounter for gynecological examination (general) (routine) without abnormal findings: Secondary | ICD-10-CM | POA: Diagnosis not present

## 2018-02-01 DIAGNOSIS — E668 Other obesity: Secondary | ICD-10-CM

## 2018-02-01 DIAGNOSIS — Z803 Family history of malignant neoplasm of breast: Secondary | ICD-10-CM

## 2018-02-01 DIAGNOSIS — G43009 Migraine without aura, not intractable, without status migrainosus: Secondary | ICD-10-CM

## 2018-02-01 MED ORDER — LEVONORGESTREL-ETHINYL ESTRAD 0.1-20 MG-MCG PO TABS: 1.0000 | ORAL_TABLET | Freq: Every day | ORAL | 4 refills | Status: DC

## 2018-02-01 NOTE — Progress Notes (Signed)
GYNECOLOGY ANNUAL PHYSICAL EXAM PROGRESS NOTE  Subjective:    Hannah Chang is a 52 y.o. G2P2002 single female who presents for an annual exam.  The patient is not currently sexually active. The patient wears seatbelts: yes. The patient participates in regular exercise: yes. Has the patient ever been transfused or tattooed?: no. The patient reports that there is not domestic violence in her life.   Notes that her mother recently passed 2 weeks ago from double pneumonia. Is trying to cope as best as she can. Does have family and friends for support. Is grieving appropriately.   The patient has the following complaints today: 1. None   Gynecologic History  Menarche age: 39 No LMP recorded. (Menstrual status: Oral contraceptives). Contraception: OCP (estrogen/progesterone) - continuous regimen (used for migraine control) History of STI's: Denies Last Pap: 09/2016. 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 Previously had ASCUS HPV+ pap smear in 2017 with CIN I on biopsies.  Last mammogram: 08/2016. Results were: negative   OB History  Gravida Para Term Preterm AB Living  2 2 2  0 0 2  SAB TAB Ectopic Multiple Live Births  0 0 0 0 2    # Outcome Date GA Lbr Len/2nd Weight Sex Delivery Anes PTL Lv  2 Term     M Vag-Spont   LIV  1 Term     F CS-Unspec   LIV    Past Medical History:  Diagnosis Date  . Environmental allergies   . Exercise-induced asthma   . Migraines   . Shingles     Past Surgical History:  Procedure Laterality Date  . CESAREAN SECTION  1992  . EYE SURGERY    . NASAL SEPTUM SURGERY     X 2  . TONSILLECTOMY      Family History  Problem Relation Age of Onset  . Breast cancer Mother 10  . Breast cancer Maternal Aunt 46    Social History   Socioeconomic History  . Marital status: Divorced    Spouse name: Not on file  . Number of children: Not on file  . Years of education: Not on file  . Highest  education level: Not on file  Occupational History  . Not on file  Social Needs  . Financial resource strain: Not on file  . Food insecurity:    Worry: Not on file    Inability: Not on file  . Transportation needs:    Medical: Not on file    Non-medical: Not on file  Tobacco Use  . Smoking status: Never Smoker  . Smokeless tobacco: Never Used  Substance and Sexual Activity  . Alcohol use: Yes    Comment: ONCE A MONTH  . Drug use: No  . Sexual activity: Not Currently    Birth control/protection: None, Pill  Lifestyle  . Physical activity:    Days per week: Not on file    Minutes per session: Not on file  . Stress: Not on file  Relationships  . Social connections:    Talks on phone: Not on file    Gets together: Not on file    Attends religious service: Not on file    Active member of club or organization: Not on file    Attends meetings of clubs or organizations: Not on file    Relationship status: Not on file  . Intimate partner violence:    Fear of current or ex partner:  Not on file    Emotionally abused: Not on file    Physically abused: Not on file    Forced sexual activity: Not on file  Other Topics Concern  . Not on file  Social History Narrative  . Not on file    Current Outpatient Medications on File Prior to Visit  Medication Sig Dispense Refill  . Albuterol Sulfate (PROAIR HFA IN) Inhale into the lungs as needed.    . fexofenadine (ALLEGRA) 180 MG tablet Take 180 mg by mouth as needed for allergies or rhinitis.    . fluticasone (FLONASE) 50 MCG/ACT nasal spray Place into both nostrils daily.    Marland Kitchen. levalbuterol (XOPENEX HFA) 45 MCG/ACT inhaler Inhale into the lungs.    Marland Kitchen. levonorgestrel-ethinyl estradiol (AVIANE) 0.1-20 MG-MCG tablet Take 1 tablet by mouth daily. TO BE TAKEN CONTINUOUSLY 1 Package 0  . valACYclovir (VALTREX) 1000 MG tablet Take 1 tablet (1,000 mg total) by mouth 3 (three) times daily. Take at first sign of rash 21 tablet 2   No current  facility-administered medications on file prior to visit.     Allergies  Allergen Reactions  . Cephalexin Other (See Comments)    REACTION: whelps REACTION: whelps  . Doxycycline Hyclate     REACTION: as child  . Doxycycline Hyclate Other (See Comments)    REACTION: as child  . Erythromycin Other (See Comments)    REACTION: as child REACTION: as child  . Sulfa Antibiotics Other (See Comments)    Because of the reaction she had with Keflex  Because of the reaction she had with Keflex  UNKNOWN     Review of Systems Constitutional: negative for chills, fatigue, fevers and sweats Eyes: negative for irritation, redness and visual disturbance Ears, nose, mouth, throat, and face: negative for hearing loss, nasal congestion, snoring and tinnitus Respiratory: negative for asthma, cough, sputum Cardiovascular: negative for chest pain, dyspnea, exertional chest pressure/discomfort, irregular heart beat, palpitations and syncope Gastrointestinal: negative for abdominal pain, change in bowel habits, nausea and vomiting Genitourinary: negative for abnormal menstrual periods, genital lesions, sexual problems and vaginal discharge, dysuria and urinary incontinence Integument/breast: negative for breast lump, breast tenderness and nipple discharge Hematologic/lymphatic: negative for bleeding and easy bruising Musculoskeletal:negative for back pain and muscle weakness Neurological: negative for dizziness, headaches, vertigo and weakness Endocrine: negative for diabetic symptoms including polydipsia, polyuria and skin dryness Allergic/Immunologic: negative for hay fever and urticaria      Objective:  Blood pressure 120/78, pulse 89, height 5\' 4"  (1.626 m), weight 231 lb 9.6 oz (105.1 kg). Body mass index is 39.75 kg/m.  General Appearance:    Alert, cooperative, no distress, appears stated age, moderately obese  Head:    Normocephalic, without obvious abnormality, atraumatic  Eyes:    PERRL,  conjunctiva/corneas clear, EOM's intact, both eyes  Ears:    Normal external ear canals, both ears  Nose:   Nares normal, septum midline, mucosa normal, no drainage or sinus tenderness  Throat:   Lips, mucosa, and tongue normal; teeth and gums normal  Neck:   Supple, symmetrical, trachea midline, no adenopathy; thyroid: no enlargement/tenderness/nodules; no carotid bruit or JVD  Back:     Symmetric, no curvature, ROM normal, no CVA tenderness  Lungs:     Clear to auscultation bilaterally, respirations unlabored  Chest Wall:    No tenderness or deformity   Heart:    Regular rate and rhythm, S1 and S2 normal, no murmur, rub or gallop  Breast Exam:  No tenderness, masses, or nipple abnormality  Abdomen:     Soft, non-tender, bowel sounds active all four quadrants, no masses, no organomegaly.    Genitalia:    Pelvic:external genitalia normal, vagina without lesions, discharge, or tenderness, rectovaginal septum  normal. Cervix normal in appearance, no cervical motion tenderness, no adnexal masses or tenderness.  Uterus normal size, shape, mobile, regular contours, nontender.  Rectal:    Normal external sphincter.  No hemorrhoids appreciated. Internal exam not done.   Extremities:   Extremities normal, atraumatic, no cyanosis or edema  Pulses:   2+ and symmetric all extremities  Skin:   Skin color, texture, turgor normal, no rashes or lesions.  Multiple freckles present.   Lymph nodes:   Cervical, supraclavicular, and axillary nodes normal  Neurologic:   CNII-XII intact, normal strength, sensation and reflexes throughout   .  Labs:  Lab Results  Component Value Date   WBC 7.6 01/14/2017   HGB 13.0 01/14/2017   HCT 39.6 01/14/2017   MCV 97 01/14/2017   PLT 321 01/14/2017    Lab Results  Component Value Date   CREATININE 0.74 01/14/2017   BUN 9 01/14/2017   NA 140 01/14/2017   K 5.3 (H) 01/14/2017   CL 102 01/14/2017   CO2 19 (L) 01/14/2017    Lab Results  Component Value Date    ALT 20 01/14/2017   AST 34 01/14/2017   ALKPHOS 81 01/14/2017   BILITOT 0.3 01/14/2017    Lab Results  Component Value Date   TSH 2.760 01/14/2017     Assessment:   Routine annual gynecologic exam.  Moderate obesity CIN I Family h/o breast cancer Migraines Grief  Contraception management  Plan:    Blood tests ordered: CBC, CMP, TSH, Lipid panel.  Patient is fasting.  Breast self exam technique reviewed and patient encouraged to perform self-exam monthly. Contraception: OCP (estrogen/progesterone) and desires refill.  Refill given. Discussed healthy lifestyle modifications. Pap smear performed today for screening and h/o abnormal pap smear. Mammogram ordered.  Family h/o breast cancer, previously discussed genetic cancer screening.  Patient still undecided. Declines flu vaccine at job, will get at her job.  Migraines managed with OCPs.  To f/u in 1 year for annual exam.     Hildred Laser, MD [facility]

## 2018-02-01 NOTE — Patient Instructions (Signed)

## 2018-02-01 NOTE — Progress Notes (Signed)
PT is present today for her annual exam. Pt stated that she has been doing self-breast exams monthly. Pt stated that she is doing well and denies any issues. No problems or concerns.     

## 2018-02-02 LAB — COMPREHENSIVE METABOLIC PANEL
ALBUMIN: 3.7 g/dL (ref 3.5–5.5)
ALT: 13 IU/L (ref 0–32)
AST: 15 IU/L (ref 0–40)
Albumin/Globulin Ratio: 1.4 (ref 1.2–2.2)
Alkaline Phosphatase: 72 IU/L (ref 39–117)
BUN / CREAT RATIO: 15 (ref 9–23)
BUN: 11 mg/dL (ref 6–24)
Bilirubin Total: 0.3 mg/dL (ref 0.0–1.2)
CHLORIDE: 103 mmol/L (ref 96–106)
CO2: 22 mmol/L (ref 20–29)
CREATININE: 0.71 mg/dL (ref 0.57–1.00)
Calcium: 8.7 mg/dL (ref 8.7–10.2)
GFR calc Af Amer: 116 mL/min/{1.73_m2} (ref >59)
GFR calc non Af Amer: 100 mL/min/{1.73_m2} (ref >59)
GLUCOSE: 91 mg/dL (ref 65–99)
Globulin, Total: 2.7 g/dL (ref 1.5–4.5)
Potassium: 4.5 mmol/L (ref 3.5–5.2)
Sodium: 140 mmol/L (ref 134–144)
Total Protein: 6.4 g/dL (ref 6.0–8.5)

## 2018-02-04 LAB — CYTOLOGY - PAP

## 2018-02-17 ENCOUNTER — Telehealth: Payer: Self-pay

## 2018-03-10 NOTE — Telephone Encounter (Signed)
n

## 2018-04-05 ENCOUNTER — Encounter: Payer: Self-pay | Admitting: Obstetrics and Gynecology

## 2018-04-05 ENCOUNTER — Other Ambulatory Visit (HOSPITAL_COMMUNITY)
Admission: RE | Admit: 2018-04-05 | Discharge: 2018-04-05 | Disposition: A | Discharge status: Home / Self Care | Payer: BC Managed Care – PPO | Source: Ambulatory Visit | Attending: Obstetrics and Gynecology | Admitting: Obstetrics and Gynecology

## 2018-04-05 ENCOUNTER — Ambulatory Visit (INDEPENDENT_AMBULATORY_CARE_PROVIDER_SITE_OTHER): Payer: BC Managed Care – PPO | Admitting: Obstetrics and Gynecology

## 2018-04-05 VITALS — BP 135/93 | HR 97 | Ht 64.0 in | Wt 235.2 lb

## 2018-04-05 DIAGNOSIS — J45991 Cough variant asthma: Secondary | ICD-10-CM | POA: Diagnosis not present

## 2018-04-05 DIAGNOSIS — R87612 Low grade squamous intraepithelial lesion on cytologic smear of cervix (LGSIL): Secondary | ICD-10-CM | POA: Diagnosis present

## 2018-04-05 MED ORDER — NEBULIZER DEVI: 1.0000 | Freq: Two times a day (BID) | 0 refills | Status: DC | PRN

## 2018-04-05 MED ORDER — FLUTICASONE PROPIONATE (INHAL) 50 MCG/BLIST IN AEPB: 1.0000 | INHALATION_SPRAY | Freq: Two times a day (BID) | RESPIRATORY_TRACT | 12 refills | Status: DC

## 2018-04-05 MED ORDER — IPRATROPIUM BROMIDE 0.02 % IN SOLN: 0.5000 mg | Freq: Four times a day (QID) | RESPIRATORY_TRACT | 12 refills | Status: AC | PRN

## 2018-04-05 NOTE — Progress Notes (Signed)
Pt is present today for a colpo. Pt stated that she has a few questions about other options to keep from having so many colpos. .Marland Kitchen

## 2018-04-05 NOTE — Patient Instructions (Addendum)
Human Papillomavirus Human papillomavirus (HPV) is the most common sexually transmitted infection (STI). It easily spreads from person to person (is highly contagious). HPV infections cause genital warts. Certain types of HPV may cause cancers, including cancer of the lower part of the uterus (cervix), vagina, outer female genital area (vulva), penis, anus, and rectum. HPV may also cause cancers of the oral cavity, such as the throat, tongue, and tonsils. There are many types of HPV. It usually does not cause symptoms. However, sometimes there are wart-like lesions in the throat or warts in the genital area that you can see or feel. It is possible to be infected for long periods and pass HPV to others without knowing it. What are the causes? HPV is caused by a virus that spreads from person to person through sexual contact. This includes oral, vaginal, or anal sex. What increases the risk? The following factors may make you more likely to develop this condition:  Having unprotected oral, vaginal, or anal sex.  Having several sex partners.  Having a sex partner who has other sex partners.  Having or having had another STI.  Having a weak disease-fighting (immune) system.  Having damaged skin in the genital area.  What are the signs or symptoms? Most people who have HPV do not have any symptoms. If symptoms are present, they may include:  Wartlike lesions in the throat (from having oral sex).  Warts on the infected skin or mucous membranes.  Genital warts that may itch, burn, bleed, or be painful during sexual intercourse.  How is this diagnosed? If wartlike lesions are present in the throat or if genital warts are present, your health care provider can usually diagnose HPV with a physical exam. Genital warts are easily seen. In females, tests may be used to diagnose HPV, including:  A Pap test. A Pap test takes a sample of cells from your cervix to check for cancer and HPV  infection.  An HPV test. This is similar to a Pap test and involves taking a sample of cells from your cervix.  Using a scope to view the cervix (colposcopy). This may be done if a pelvic exam or Pap test is abnormal. A sample of tissue may be removed for testing (biopsy) during the colposcopy.  Currently, there is no test to detect HPV in males. How is this treated? There is no treatment for the virus itself. However, there are treatments for the health problems and symptoms HPV can cause. Your health care provider will monitor you closely after you are treated as HPV can come back and may need treatment again. Treatment for HPV may include:  Medicines, which may be injected or applied to genital warts in a cream, lotion, liquid or gel form.  Use of a probe to apply extreme cold (cryotherapy) to the genital warts.  Application of an intense beam of light (laser treatment) on the genital warts.  Use of a probe to apply extreme heat (electrocautery) on the genital warts.  Surgery to remove the genital warts.  Follow these instructions at home: Medicines  Take over-the-counter and prescription medicines only as told by your health care provider. This include creams for itching or irritation.  Do not treat genital warts with medicines used for treating hand warts. General instructions  Do not touch or scratch the warts.  Do not have sex while you are being treated.  Do not douche or use tampons during treatment (women).  Tell your sex partner about your infection.  He or she may also need to be treated.  If you become pregnant, tell your health care provider that you have HPV. Your health care provider will monitor you closely during pregnancy to make sure your baby is safe.  Keep all follow-up visits as told by your health care provider. This is important. How is this prevented?  Talk with your health care provider about getting the HPV vaccines. These vaccines prevent some HPV  infections and cancers. The vaccines are recommended for males and females between the ages of 429 and 6926. They will not work if you already have HPV, and they are not recommended for pregnant women.  After treatment, use condoms during sex to prevent future infections.  Have only one sex partner.  Have a sex partner who does not have other sex partners.  Get regular Pap tests as directed by your health care provider. Contact a health care provider if:  The treated skin becomes red, swollen, or painful.  You have a fever.  You feel generally ill.  You feel lumps or pimples sticking out in and around your genital area.  You develop bleeding of the vagina or the treatment area.  You have painful sexual intercourse. Summary  Human papillomavirus (HPV) is the most common sexually transmitted infection (STI) and is highly contagious.  Most people carrying HPV do not have any symptoms.  HPV can be prevented with vaccination. The vaccine is recommended for males and females between the ages of 949 and 5826.  There is no treatment for the virus itself. However, there are treatments for the health problems and symptoms HPV can cause. This information is not intended to replace advice given to you by your health care provider. Make sure you discuss any questions you have with your health care provider. Document Released: 07/25/2003 Document Revised: 04/12/2016 Document Reviewed: 04/12/2016 Elsevier Interactive Patient Education  2018 Elsevier Inc.     Colposcopy, Care After This sheet gives you information about how to care for yourself after your procedure. Your health care provider may also give you more specific instructions. If you have problems or questions, contact your health care provider. What can I expect after the procedure? If you had a colposcopy without a biopsy, you can expect to feel fine right away, but you may have some spotting for a few days. You can go back to your  normal activities. If you had a colposcopy with a biopsy, it is common to have:  Soreness and pain. This may last for a few days.  Light-headedness.  Mild vaginal bleeding or dark-colored, grainy discharge. This may last for a few days. The discharge may be due to a solution that was used during the procedure. You may need to wear a sanitary pad during this time.  Spotting for at least 48 hours after the procedure.  Follow these instructions at home:  Take over-the-counter and prescription medicines only as told by your health care provider. Talk with your health care provider about what type of over-the-counter pain medicine and prescription medicine you can start taking again. It is especially important to talk with your health care provider if you take blood-thinning medicine.  Do not drive or use heavy machinery while taking prescription pain medicine.  For at least 3 days after your procedure, or as long as told by your health care provider, avoid: ? Douching. ? Using tampons. ? Having sexual intercourse.  Continue to use birth control (contraception).  Limit your physical activity for the  start taking again. It is especially important to talk with your health care provider if you take blood-thinning medicine.  · Do not drive or use heavy machinery while taking prescription pain medicine.  · For at least 3 days after your procedure, or as long as told by your health care provider, avoid:  ? Douching.  ? Using tampons.  ? Having sexual intercourse.  · Continue to use birth control (contraception).  · Limit your physical activity for the first day after the procedure as told by your health care provider. Ask your health care provider what activities are safe for you.  · It is up to you to get the results of your procedure. Ask your health care provider, or the department performing the procedure, when your results will be ready.  · Keep all follow-up visits as told by your health care provider. This is important.  Contact a health care provider if:  · You develop a skin rash.  Get help right away if:  · You are bleeding heavily from your vagina or you are passing blood clots. This includes using more than one sanitary pad per hour for 2 hours in a row.  · You have a fever or chills.  · You have pelvic pain.  · You have abnormal, yellow-colored, or bad-smelling vaginal discharge. This could be a sign of infection.  · You have severe pain or  cramps in your lower abdomen that do not get better with medicine.  · You feel light-headed or dizzy, or you faint.  Summary  · If you had a colposcopy without a biopsy, you can expect to feel fine right away, but you may have some spotting for a few days. You can go back to your normal activities.  · If you had a colposcopy with a biopsy, you may notice mild pain and spotting for 48 hours after the procedure.  · Avoid douching, using tampons, and having sexual intercourse for 3 days after the procedure or as long as told by your health care provider.  · Contact your health care provider if you have bleeding, severe pain, or signs of infection.  This information is not intended to replace advice given to you by your health care provider. Make sure you discuss any questions you have with your health care provider.  Document Released: 02/22/2013 Document Revised: 12/20/2015 Document Reviewed: 12/20/2015  Elsevier Interactive Patient Education © 2018 Elsevier Inc.

## 2018-04-06 NOTE — Progress Notes (Signed)
    GYNECOLOGY PROGRESS NOTE  Subjective:    Patient ID: Hannah Chang, female    DOB: 05/23/1965, 52 y.o.   MRN: [id number]  HPI  Patient is a 52 y.o. 712P2002 female who presents for colposcopy. For low-grade squamous intraepithelial neoplasia (LGSIL - encompassing HPV,mild dysplasia,CIN I) pap smear on 02/01/2018. 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  Of note, patient had an abnormal pap smear 01/14/2017 with LGSIL, followed by colposcopy with CIN I. Patient wonders if there is something else she can do to prevent having to have so many colposcopies such as a hysterectomy.    ROS:  Patient notes that she is still having some difficulties breathing. States that she had reaction to a medication and was given Prednisone and Advair.  Notes a reaction to the Advair (blistering of mouth).  Reports the prednisone mad her have muscle spasms in her chest.  She is still having a cough and difficulty with deep breathing.  Has been seen by her allergy specialist and her PCP. Is currently on Allegra. Is wondering if she could have been exposed to something environmental (notes she was cleaning her mother's house after her passing and she had an old air conditioner)   PROCEDURE NOTE:  Patient given informed consent, signed copy in the chart, time out was performed.  Placed in lithotomy position. Cervix viewed with speculum and colposcop after application of acetic acid.   Colposcopy adequate? Yes  no visible lesions, no mosaicism, no punctation and no abnormal vasculature; no corresponding biopsies obtained.  ECC specimen obtained. All specimens were labeled and sent to pathology.   Patient was given post procedure instructions.  Will follow up pathology and manage accordingly; patient will be contacted with results and recommendations.  Routine preventative health maintenance measures emphasized. Advised that at this time a hysterectomy was not indicated, however if cervical  dysplasia worsened, could consider. Instructed on methods off boosting immune systems, elimination of any risk factors for HPV.    Discussed that patient may need to bee seen by an Immunologist or Infectious Disease if symptoms continue.  Will prescribe Flovent and Atrovent nebulizer. Advised to continue Allegra.      A total of 15 minutes were spent face-to-face with the patient during this encounter and over half of that time dealt with counseling and coordination of care.    Hildred Laserherry, Desera Graffeo, MD [facility]

## 2018-04-11 ENCOUNTER — Telehealth: Payer: Self-pay | Admitting: Obstetrics and Gynecology

## 2018-04-11 MED ORDER — NEBULIZER DEVI: 1.0000 | Freq: Two times a day (BID) | 0 refills | Status: AC | PRN

## 2018-04-11 NOTE — Telephone Encounter (Signed)
If the patient can not be reach at her cell number she would like a call at work [phone].

## 2018-04-11 NOTE — Telephone Encounter (Signed)
The patient called and stated that she is unable to get the nebulizer machine at [address] [facility] due to that pharmacy not carrying that machine e. The patient  has not been able to start her medication due to her needing that Script/machine called in to another facility. The patient is requesting a call back to clear this issue up to start treatment.

## 2018-04-11 NOTE — Telephone Encounter (Signed)
Pt called and informed that her nebulizer machine prescription was sent to [facility] in [place].

## 2018-04-26 ENCOUNTER — Ambulatory Visit
Admission: RE | Admit: 2018-04-26 | Discharge: 2018-04-26 | Disposition: A | Discharge status: Home / Self Care | Payer: BC Managed Care – PPO | Source: Ambulatory Visit | Attending: Obstetrics and Gynecology | Admitting: Obstetrics and Gynecology

## 2018-04-26 DIAGNOSIS — Z1231 Encounter for screening mammogram for malignant neoplasm of breast: Secondary | ICD-10-CM | POA: Diagnosis present

## 2018-12-07 ENCOUNTER — Telehealth: Payer: Self-pay | Admitting: Pulmonary Disease

## 2018-12-07 NOTE — Telephone Encounter (Signed)
Okay to proceed with visit per Erasmo Downer and I.

## 2018-12-07 NOTE — Telephone Encounter (Signed)
Called patient for COVID-19 pre-screening for in office visit.  Have you recently traveled any where out of the local area in the last 2 weeks? [place], [place]- just returned  Have you been in close contact with a person diagnosed with COVID-19 or someone awaiting results within the last 2 weeks? No  Do you currently have any of the following symptoms? If so, when did they start? Cough     Diarrhea   Joint Pain Fever      Muscle Pain   Red eyes Shortness of breath (Yes- Since Oct.)     Abdominal pain  Vomiting Loss of smell    Rash    Sore Throat Headache    Weakness   Bruising or bleeding   Okay to proceed with visit. (date)  / Needs to reschedule visit. (date)

## 2018-12-07 NOTE — Telephone Encounter (Signed)
Nothing further needed 

## 2018-12-08 ENCOUNTER — Institutional Professional Consult (permissible substitution): Payer: BC Managed Care – PPO | Admitting: Pulmonary Disease

## 2018-12-16 ENCOUNTER — Other Ambulatory Visit: Payer: Self-pay | Admitting: Obstetrics and Gynecology

## 2018-12-16 ENCOUNTER — Institutional Professional Consult (permissible substitution): Payer: BC Managed Care – PPO | Admitting: Pulmonary Disease

## 2018-12-16 DIAGNOSIS — Z20822 Contact with and (suspected) exposure to covid-19: Secondary | ICD-10-CM

## 2018-12-18 LAB — NOVEL CORONAVIRUS, NAA: SARS-CoV-2, NAA: NOT DETECTED

## 2018-12-20 ENCOUNTER — Telehealth: Payer: Self-pay | Admitting: Pulmonary Disease

## 2018-12-20 NOTE — Telephone Encounter (Signed)
Pt wants to go ahead and get consult appt. Scheduled for SOB but pt is still not feeling well. Has had the following symptoms for 2 weeks but got tested for COVID last Friday and it came back negative.   Sore throat  No fever today but low grade yesterday- 99.0 Nasal congestion Runny nose SOB- since Oct.  Body aches a week ago  Cough- minimal due to drainage

## 2018-12-20 NOTE — Telephone Encounter (Signed)
Called pt to advise of recommended phone visit. Left a vm for pt to call back.

## 2018-12-20 NOTE — Telephone Encounter (Signed)
Recommend phone visit with below symptoms.

## 2018-12-21 NOTE — Telephone Encounter (Signed)
Spoke with pt and let her know that due to her symptoms, a phone or virtual visit is recommended. She understood but prefers to come in person, I offered to schedule a OV further out but she stated that she will call us to keep [place] updated as her symptoms change or get better and then go from there. Nothing further needed

## 2018-12-30 ENCOUNTER — Other Ambulatory Visit: Payer: Self-pay | Admitting: Obstetrics and Gynecology

## 2018-12-30 DIAGNOSIS — Z3041 Encounter for surveillance of contraceptive pills: Secondary | ICD-10-CM

## 2018-12-30 NOTE — Telephone Encounter (Signed)
Pt request a refill of ocp.  [facility] [address].

## 2018-12-30 NOTE — Telephone Encounter (Signed)
Looks like [facility] filled it.

## 2019-01-04 ENCOUNTER — Ambulatory Visit
Admission: RE | Admit: 2019-01-04 | Discharge: 2019-01-04 | Disposition: A | Discharge status: Home / Self Care | Payer: BC Managed Care – PPO | Source: Ambulatory Visit | Attending: Pulmonary Disease | Admitting: Pulmonary Disease

## 2019-01-04 ENCOUNTER — Ambulatory Visit: Payer: BC Managed Care – PPO | Admitting: Pulmonary Disease

## 2019-01-04 ENCOUNTER — Ambulatory Visit
Admission: RE | Admit: 2019-01-04 | Discharge: 2019-01-04 | Disposition: A | Discharge status: Home / Self Care | Payer: BC Managed Care – PPO | Attending: Pulmonary Disease | Admitting: Pulmonary Disease

## 2019-01-04 ENCOUNTER — Encounter: Payer: Self-pay | Admitting: Pulmonary Disease

## 2019-01-04 ENCOUNTER — Other Ambulatory Visit: Payer: Self-pay

## 2019-01-04 VITALS — BP 122/78 | HR 94 | Temp 98.4°F | Ht 64.0 in | Wt 246.2 lb

## 2019-01-04 DIAGNOSIS — R0602 Shortness of breath: Secondary | ICD-10-CM

## 2019-01-04 DIAGNOSIS — J4520 Mild intermittent asthma, uncomplicated: Secondary | ICD-10-CM | POA: Diagnosis not present

## 2019-01-04 DIAGNOSIS — K219 Gastro-esophageal reflux disease without esophagitis: Secondary | ICD-10-CM | POA: Diagnosis not present

## 2019-01-04 MED ORDER — QVAR REDIHALER 80 MCG/ACT IN AERB: 2.0000 | INHALATION_SPRAY | Freq: Two times a day (BID) | RESPIRATORY_TRACT | 3 refills | Status: AC

## 2019-01-04 NOTE — Patient Instructions (Signed)
1.  We will give a trial of Qvar 80 mcg 2 puffs twice a day, rinse mouth well after use  2.  You may use Xopenex 2 puffs every 6 hours as needed for shortness of breath  3.  We are going to get a chest x-ray and pulmonary function tests scheduled for you to evaluate your shortness of breath.  4.  We will see you in follow-up in 6 to 8 weeks time.  5.  Recommend taking Pepcid AC over-the-counter at bedtime.

## 2019-01-04 NOTE — Progress Notes (Signed)
Subjective:    Patient ID: Hannah Chang, female    DOB: 03/03/1966, 53 y.o.   MRN: [id number]  HPI The patient is a 53 year old lifelong never smoker with a history of asthma since approximately age 53 who presents for evaluation of increasing shortness of breath worse since October 2019.  She is kindly referred by Dr. Hildred LaserAnika Cherry.  The patient states that previously she has been treated with albuterol as needed but that this makes her very jittery.  For this reason she uses Xopenex but uses this only sporadically.  She has tried ICS dry powder inhalers with difficulties due to upper airway irritation.  She has been doing well with regards to her asthma but on October 2019 she had to clean her mother's house and was exposed to mold and dust even though she wore a mask during the activity.  She notices that since then she has been more aware of her sensation of dyspnea.  She is known to have multiple environmental allergies and multiple medication allergies.  She has had issues with obesity but has not noticed increased weight.  She occasionally describes globus sensation particularly when eating certain foods.  During her evaluation today she is noted to have almost constant throat clearing.  She does note intolerance to milk due to increased mucus.  She is to have an allergy reevaluation as she feels that her sensitivities to foods have gotten worse.  She does not have orthopnea or paroxysmal nocturnal dyspnea.  No palpitations.  No lower extremity edema.  She notices her dyspnea mostly with exertion.  She has not had cough or sputum production.  Past medical history, surgical history and family history have been reviewed and are as noted.  Social history: She is a lifelong never smoker she works at [employer], currently working mostly from the home remotely due to the COVID-19 pandemic.  Review of Systems  Constitutional: Negative.   HENT: Positive for congestion and postnasal drip.         Globus sensation with certain foods  Eyes: Negative.   Respiratory: Positive for shortness of breath (On exertion).   Cardiovascular: Negative.   Gastrointestinal:       Does not describe GERD symptoms per se but does have frequent dyspepsia  Endocrine: Negative.   Genitourinary: Negative.   Skin: Negative.   Allergic/Immunologic: Positive for environmental allergies and food allergies.  Neurological: Negative.   Hematological: Negative.   Psychiatric/Behavioral: Negative.   All other systems reviewed and are negative.      Objective:   Physical Exam Vitals signs and nursing note reviewed.  Constitutional:      General: She is not in acute distress.    Appearance: Normal appearance. She is obese. She is not ill-appearing.  HENT:     Head: Normocephalic and atraumatic.     Right Ear: External ear normal.     Left Ear: External ear normal.     Nose:     Comments: HEENT Limited due to masking requirements Eyes:     General: No scleral icterus.    Extraocular Movements: Extraocular movements intact.     Conjunctiva/sclera: Conjunctivae normal.     Pupils: Pupils are equal, round, and reactive to light.  Neck:     Musculoskeletal: Neck supple.     Thyroid: No thyromegaly.     Trachea: Trachea and phonation normal.  Cardiovascular:     Rate and Rhythm: Normal rate and regular rhythm.     Pulses:  Normal pulses.     Heart sounds: Normal heart sounds, S1 normal and S2 normal. No murmur. No friction rub. No gallop.   Pulmonary:     Effort: Pulmonary effort is normal. No respiratory distress.     Breath sounds: No wheezing or rhonchi.     Comments: Somewhat distant breath sounds, slightly coarse Abdominal:     General: Abdomen is protuberant. There is no distension.  Musculoskeletal:     Right lower leg: No edema.     Left lower leg: No edema.  Lymphadenopathy:     Cervical: No cervical adenopathy.  Skin:    General: Skin is warm and dry.     Findings: No rash.   Neurological:     General: No focal deficit present.     Mental Status: She is alert.  Psychiatric:        Mood and Affect: Mood normal.        Behavior: Behavior normal.    Chest x-ray was independently reviewed and is as noted below: No acute abnormalities noted.        Assessment & Plan:   1.  Shortness of breath on exertion: This is likely multifactorial.  One issue is her issues with asthma, severity yet to be determined, and environmental allergies.  She is to have an allergy reevaluation which I recommend.  She has difficulties with dry powder inhalers and we will give her a trial of Qvar 80 mcg 2 puffs twice a day with good mouth rinsing after use.  She was advised that she can continue using Xopenex 2 puffs every 6 hours as needed for shortness of breath.  Her chest x-ray today does not show any acute nor chronic pulmonary abnormality.  She will have formal pulmonary function testing to evaluate her dyspnea further.  2.  Gastroesophageal reflux: This is highly likely given her issues with globus sensation and constant throat clearing this may indicate a level of laryngopharyngeal reflux, she was instructed on antireflux measures and will start with a trial of Pepcid 20 mg at bedtime.  If this is not helpful may try PPI.  3.  Severe obesity with BMI of 42.26: This issue adds complexity to her management and will also aggravate her sensation of dyspnea.  Weight loss has been recommended.   We will see the patient in follow-up in 6 to 8 weeks time.  She is to contact us prior to that time should any new difficulties arise.  Thank you for allowing [place] to participate in this patient's care.

## 2019-02-02 ENCOUNTER — Telehealth: Payer: Self-pay | Admitting: Obstetrics and Gynecology

## 2019-02-02 DIAGNOSIS — Z3041 Encounter for surveillance of contraceptive pills: Secondary | ICD-10-CM

## 2019-02-02 MED ORDER — LEVONORGESTREL-ETHINYL ESTRAD 0.1-20 MG-MCG PO TABS: 1.0000 | ORAL_TABLET | Freq: Every day | ORAL | 0 refills | Status: DC

## 2019-02-02 NOTE — Telephone Encounter (Signed)
The patient called and stated that she needs her medication sent to her pharmacy for her birth control. The patient stated she is about to run out and she has also rescheduled her Annual exam as well. Please advise.

## 2019-02-02 NOTE — Telephone Encounter (Signed)
Spoke with pt and sent in her medication refill.

## 2019-02-06 ENCOUNTER — Encounter: Payer: BC Managed Care – PPO | Admitting: Obstetrics and Gynecology

## 2019-02-23 ENCOUNTER — Other Ambulatory Visit (HOSPITAL_COMMUNITY)
Admission: RE | Admit: 2019-02-23 | Discharge: 2019-02-23 | Disposition: A | Discharge status: Home / Self Care | Payer: BC Managed Care – PPO | Source: Ambulatory Visit | Attending: Obstetrics and Gynecology | Admitting: Obstetrics and Gynecology

## 2019-02-23 ENCOUNTER — Encounter: Payer: Self-pay | Admitting: Obstetrics and Gynecology

## 2019-02-23 ENCOUNTER — Other Ambulatory Visit: Payer: Self-pay

## 2019-02-23 ENCOUNTER — Ambulatory Visit (INDEPENDENT_AMBULATORY_CARE_PROVIDER_SITE_OTHER): Payer: BC Managed Care – PPO | Admitting: Obstetrics and Gynecology

## 2019-02-23 VITALS — BP 148/98 | HR 92 | Ht 64.0 in | Wt 243.4 lb

## 2019-02-23 DIAGNOSIS — N870 Mild cervical dysplasia: Secondary | ICD-10-CM | POA: Insufficient documentation

## 2019-02-23 DIAGNOSIS — R030 Elevated blood-pressure reading, without diagnosis of hypertension: Secondary | ICD-10-CM

## 2019-02-23 DIAGNOSIS — Z23 Encounter for immunization: Secondary | ICD-10-CM | POA: Diagnosis not present

## 2019-02-23 DIAGNOSIS — Z01419 Encounter for gynecological examination (general) (routine) without abnormal findings: Secondary | ICD-10-CM

## 2019-02-23 DIAGNOSIS — Z1231 Encounter for screening mammogram for malignant neoplasm of breast: Secondary | ICD-10-CM

## 2019-02-23 DIAGNOSIS — Z803 Family history of malignant neoplasm of breast: Secondary | ICD-10-CM

## 2019-02-23 DIAGNOSIS — Z3041 Encounter for surveillance of contraceptive pills: Secondary | ICD-10-CM

## 2019-02-23 NOTE — Progress Notes (Signed)
GYNECOLOGY ANNUAL PHYSICAL EXAM PROGRESS NOTE  Subjective:    Hannah Chang is a 53 y.o. 312P2002 single female who presents for an annual exam.  The patient is not currently sexually active. The patient wears seatbelts: yes. The patient participates in regular exercise: yes. Has the patient ever been transfused or tattooed?: no. The patient reports that there is not domestic violence in her life.    The patient has the following complaints today: 1. None.  Does note she has recently undergone allergy testing for her problems breathing, seeing a specialist and adjusting to new diet as she was noted to have multiple food allergies.     Gynecologic History  Menarche age: [age] No LMP recorded. (Menstrual status: Other). Contraception: OCP (estrogen/progesterone) - continuous regimen (used for migraine control) History of STI's: Denies Last Pap: 09/2017. 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  Last mammogram: 04/26/2018. Results were: negative Last colonoscopy: patient has never had one.     OB History  Gravida Para Term Preterm AB Living  2 2 2  0 0 2  SAB TAB Ectopic Multiple Live Births  0 0 0 0 2    # Outcome Date GA Lbr Len/2nd Weight Sex Delivery Anes PTL Lv  2 Term     M Vag-Spont   LIV  1 Term     F CS-Unspec   LIV    Past Medical History:  Diagnosis Date  . Environmental allergies   . Exercise-induced asthma   . Migraines   . Shingles     Past Surgical History:  Procedure Laterality Date  . CESAREAN SECTION  1992  . EYE SURGERY    . NASAL SEPTUM SURGERY     X 2  . TONSILLECTOMY      Family History  Problem Relation Age of Onset  . Breast cancer Mother 1576  . Breast cancer Maternal Aunt [id number]    Social History   Socioeconomic History  . Marital status: Divorced    Spouse name: Not on file  . Number of  children: Not on file  . Years of education: Not on file  . Highest education level: Not on file  Occupational History  . Not on file  Social Needs  . Financial resource strain: Not on file  . Food insecurity    Worry: Not on file    Inability: Not on file  . Transportation needs    Medical: Not on file    Non-medical: Not on file  Tobacco Use  . Smoking status: Never Smoker  . Smokeless tobacco: Never Used  Substance and Sexual Activity  . Alcohol use: Yes    Comment: ONCE A MONTH  . Drug use: No  . Sexual activity: Not Currently    Birth control/protection: None, Pill  Lifestyle  . Physical activity    Days per week: Not on file    Minutes per session: Not on file  . Stress: Not on file  Relationships  . Social Musicianconnections    Talks on phone: Not on file    Gets together: Not on file    Attends religious service: Not on file    Active member of club or organization: Not on file    Attends meetings of clubs or organizations: Not on file    Relationship status: Not on file  .  Intimate partner violence    Fear of current or ex partner: Not on file    Emotionally abused: Not on file    Physically abused: Not on file    Forced sexual activity: Not on file  Other Topics Concern  . Not on file  Social History Narrative  . Not on file    Current Outpatient Medications on File Prior to Visit  Medication Sig Dispense Refill  . beclomethasone (QVAR REDIHALER) 80 MCG/ACT inhaler Inhale 2 puffs into the lungs 2 (two) times daily. 10.6 g 3  . fexofenadine (ALLEGRA) 180 MG tablet Take 180 mg by mouth as needed for allergies or rhinitis.    . fluticasone (FLONASE) 50 MCG/ACT nasal spray Place into both nostrils daily.    Marland Kitchen levalbuterol (XOPENEX HFA) 45 MCG/ACT inhaler Inhale 1-2 puffs into the lungs every 4 (four) hours as needed for wheezing.    Marland Kitchen levonorgestrel-ethinyl estradiol (AVIANE) 0.1-20 MG-MCG tablet Take 1 tablet by mouth daily. 84 tablet 0  . Respiratory Therapy  Supplies (NEBULIZER) DEVI 1 Device by Does not apply route 2 (two) times daily as needed. 1 each 0  . valACYclovir (VALTREX) 1000 MG tablet Take 1 tablet (1,000 mg total) by mouth 3 (three) times daily. Take at first sign of rash 21 tablet 2  . ipratropium (ATROVENT) 0.02 % nebulizer solution Take 2.5 mLs (0.5 mg total) by nebulization every 6 (six) hours as needed for wheezing or shortness of breath. (Patient not taking: Reported on 01/04/2019) 75 mL 12   No current facility-administered medications on file prior to visit.     Allergies  Allergen Reactions  . Pistachio Nut (Diagnostic) Itching    Internal itch   . Prednisone Cough    Muscle spasms in chest  . Cephalexin Other (See Comments)    REACTION: whelps REACTION: whelps  . Doxycycline Hyclate     REACTION: as child  . Doxycycline Hyclate Other (See Comments)    REACTION: as child  . Erythromycin Other (See Comments)    REACTION: as child REACTION: as child  . Sulfa Antibiotics Other (See Comments)    Because of the reaction she had with Keflex  Because of the reaction she had with Keflex  UNKNOWN     Review of Systems Constitutional: negative for chills, fatigue, fevers and sweats Eyes: negative for irritation, redness and visual disturbance Ears, nose, mouth, throat, and face: negative for hearing loss, nasal congestion, snoring and tinnitus Respiratory: negative for asthma, cough, sputum Cardiovascular: negative for chest pain, dyspnea, exertional chest pressure/discomfort, irregular heart beat, palpitations and syncope Gastrointestinal: negative for abdominal pain, change in bowel habits, nausea and vomiting Genitourinary: negative for abnormal menstrual periods, genital lesions, sexual problems and vaginal discharge, dysuria and urinary incontinence Integument/breast: negative for breast lump, breast tenderness and nipple discharge. Positive for discolored spot on side of left breast due to spider bite.   Hematologic/lymphatic: negative for bleeding and easy bruising Musculoskeletal:negative for back pain and muscle weakness Neurological: negative for dizziness, headaches, vertigo and weakness Endocrine: negative for diabetic symptoms including polydipsia, polyuria and skin dryness Allergic/Immunologic: negative for hay fever and urticaria      Objective:  Blood pressure (!) 148/98, pulse 92, height 5\' 4"  (1.626 m), weight 243 lb 6.4 oz (110.4 kg). Body mass index is 41.78 kg/m.  General Appearance:    Alert, cooperative, no distress, appears stated age, morbidly obese  Head:    Normocephalic, without obvious abnormality, atraumatic  Eyes:    PERRL, conjunctiva/corneas clear,  EOM's intact, both eyes  Ears:    Normal external ear canals, both ears  Nose:   Nares normal, septum midline, mucosa normal, no drainage or sinus tenderness  Throat:   Lips, mucosa, and tongue normal; teeth and gums normal  Neck:   Supple, symmetrical, trachea midline, no adenopathy; thyroid: no enlargement/tenderness/nodules; no carotid bruit or JVD  Back:     Symmetric, no curvature, ROM normal, no CVA tenderness  Lungs:     Clear to auscultation bilaterally, respirations unlabored  Chest Wall:    No tenderness or deformity   Heart:    Regular rate and rhythm, S1 and S2 normal, no murmur, rub or gallop  Breast Exam:    No tenderness, masses, or nipple abnormality. Small purple lesion on left side of breast, non-tender.   Abdomen:     Soft, non-tender, bowel sounds active all four quadrants, no masses, no organomegaly.    Genitalia:    Pelvic:external genitalia normal, vagina without lesions, discharge, or tenderness, rectovaginal septum  normal. Cervix normal in appearance, no cervical motion tenderness, no adnexal masses or tenderness.  Uterus normal size, shape, mobile, regular contours, nontender.  Rectal:    Normal external sphincter.  No hemorrhoids appreciated. Internal exam not done.   Extremities:    Extremities normal, atraumatic, no cyanosis or edema  Pulses:   2+ and symmetric all extremities  Skin:   Skin color, texture, turgor normal, no rashes or lesions.  Multiple freckles present.   Lymph nodes:   Cervical, supraclavicular, and axillary nodes normal  Neurologic:   CNII-XII intact, normal strength, sensation and reflexes throughout   .  Labs:  Labs reviewed in Care Everywhere.   Assessment:   Routine annual gynecologic exam.  Morbid obesity CIN I Family h/o breast cancer Migraines Contraception surveillance Single episode of elevated blood pressure Flu vaccine need  Plan:    Blood tests ordered: Labs reviewed in Care Everywhere Breast self exam technique reviewed and patient encouraged to perform self-exam monthly. Contraception: OCP (estrogen/progesterone).  Helps to manage migraines.  Discussed healthy lifestyle modifications. Pap smear performed today h/o abnormal pap smear and CIN I. Mammogram ordered.  Family h/o breast cancer, previously discussed genetic cancer screening.  Patient has not decided. Encouraged at least to continue routine yearly mammograms.  Received flu vaccine today.  Single episode of elevated BP, no prior history of HTN.  Will continue to monitor.  Discussed options for colon cancer screening including FOBT, serum colon cancer screening, and colonoscopy. Discussed risks/benefits of each. Given handouts to review.  Can notify MD once decision has been made, or can wait until next appointment with PCP (in February) to discuss further.  To f/u in 1 year for annual exam.     Rubie Maid, MD [facility]

## 2019-02-23 NOTE — Progress Notes (Signed)
Pt is present for annual exam. Pt stated that she is doing well other than having problems breathing which she is seeing a specialist. Pt denies any itching, burning or other issues in the vaginal area. Flu vaccine given.

## 2019-02-23 NOTE — Patient Instructions (Addendum)
Health Maintenance, Female Adopting a healthy lifestyle and getting preventive care are important in promoting health and wellness. Ask your health care provider about:  The right schedule for you to have regular tests and exams.  Things you can do on your own to prevent diseases and keep yourself healthy. What should I know about diet, weight, and exercise? Eat a healthy diet   Eat a diet that includes plenty of vegetables, fruits, low-fat dairy products, and lean protein.  Do not eat a lot of foods that are high in solid fats, added sugars, or sodium. Maintain a healthy weight Body mass index (BMI) is used to identify weight problems. It estimates body fat based on height and weight. Your health care provider can help determine your BMI and help you achieve or maintain a healthy weight. Get regular exercise Get regular exercise. This is one of the most important things you can do for your health. Most adults should:  Exercise for at least 150 minutes each week. The exercise should increase your heart rate and make you sweat (moderate-intensity exercise).  Do strengthening exercises at least twice a week. This is in addition to the moderate-intensity exercise.  Spend less time sitting. Even light physical activity can be beneficial. Watch cholesterol and blood lipids Have your blood tested for lipids and cholesterol at 51 years of age, then have this test every 5 years. Have your cholesterol levels checked more often if:  Your lipid or cholesterol levels are high.  You are older than 51 years of age.  You are at high risk for heart disease. What should I know about cancer screening? Depending on your health history and family history, you may need to have cancer screening at various ages. This may include screening for:  Breast cancer.  Cervical cancer.  Colorectal cancer.  Skin cancer.  Lung cancer. What should I know about heart disease, diabetes, and high blood  pressure? Blood pressure and heart disease  High blood pressure causes heart disease and increases the risk of stroke. This is more likely to develop in people who have high blood pressure readings, are of African descent, or are overweight.  Have your blood pressure checked: ? Every 3-5 years if you are 51-51 years of age. ? Every year if you are 93 years old or older. Diabetes Have regular diabetes screenings. This checks your fasting blood sugar level. Have the screening done:  Once every three years after age 69 if you are at a normal weight and have a low risk for diabetes.  More often and at a younger age if you are overweight or have a high risk for diabetes. What should I know about preventing infection? Hepatitis B If you have a higher risk for hepatitis B, you should be screened for this virus. Talk with your health care provider to find out if you are at risk for hepatitis B infection. Hepatitis C Testing is recommended for:  Everyone born from [birth year] through 1963.  Anyone with known risk factors for hepatitis C. Sexually transmitted infections (STIs)  Get screened for STIs, including gonorrhea and chlamydia, if: ? You are sexually active and are younger than 51 years of age. ? You are older than 51 years of age and your health care provider tells you that you are at risk for this type of infection. ? Your sexual activity has changed since you were last screened, and you are at increased risk for chlamydia or gonorrhea. Ask your health care provider if  you are at risk.  Ask your health care provider about whether you are at high risk for HIV. Your health care provider may recommend a prescription medicine to help prevent HIV infection. If you choose to take medicine to prevent HIV, you should first get tested for HIV. You should then be tested every 3 months for as long as you are taking the medicine. Pregnancy  If you are about to stop having your period (premenopausal) and  you may become pregnant, seek counseling before you get pregnant.  Take 400 to 800 micrograms (mcg) of folic acid every day if you become pregnant.  Ask for birth control (contraception) if you want to prevent pregnancy. Osteoporosis and menopause Osteoporosis is a disease in which the bones lose minerals and strength with aging. This can result in bone fractures. If you are 51 years old or older, or if you are at risk for osteoporosis and fractures, ask your health care provider if you should:  Be screened for bone loss.  Take a calcium or vitamin D supplement to lower your risk of fractures.  Be given hormone replacement therapy (HRT) to treat symptoms of menopause. Follow these instructions at home: Lifestyle  Do not use any products that contain nicotine or tobacco, such as cigarettes, e-cigarettes, and chewing tobacco. If you need help quitting, ask your health care provider.  Do not use street drugs.  Do not share needles.  Ask your health care provider for help if you need support or information about quitting drugs. Alcohol use  Do not drink alcohol if: ? Your health care provider tells you not to drink. ? You are pregnant, may be pregnant, or are planning to become pregnant.  If you drink alcohol: ? Limit how much you use to 0-1 drink a day. ? Limit intake if you are breastfeeding.  Be aware of how much alcohol is in your drink. In the [place] one drink equals one 12 oz bottle of beer (355 mL), one 5 oz glass of wine (148 mL), or one 1 oz glass of hard liquor (44 mL). General instructions  Schedule regular health, dental, and eye exams.  Stay current with your vaccines.  Tell your health care provider if: ? You often feel depressed. ? You have ever been abused or do not feel safe at home. Summary  Adopting a healthy lifestyle and getting preventive care are important in promoting health and wellness.  Follow your health care provider's instructions about healthy  diet, exercising, and getting tested or screened for diseases.  Follow your health care provider's instructions on monitoring your cholesterol and blood pressure. This information is not intended to replace advice given to you by your health care provider. Make sure you discuss any questions you have with your health care provider. Document Released: 11/17/2010 Document Revised: 04/27/2018 Document Reviewed: 04/27/2018 Elsevier Patient Education  2020 Else[employer] Inc[employer]    Influenza (Flu) Vaccine (Inactivated or Recombinant): What You Need to Know 1. Why get vaccinated? Influenza vaccine can prevent influenza (flu). Flu is a contagious disease that spreads around the [place] every year, usually between October and May. Anyone can get the flu, but it is more dangerous for some people. Infants and young children, people 65 years of age and older, pregnant women, and people with certain health conditions or a weakened immune system are at greatest risk of flu complications. Pneumonia, bronchitis, sinus infections and ear infections are examples of flu-related complications. If you have a medical condition, such as   heart disease, cancer or diabetes, flu can make it worse. Flu can cause fever and chills, sore throat, muscle aches, fatigue, cough, headache, and runny or stuffy nose. Some people may have vomiting and diarrhea, though this is more common in children than adults. Each year thousands of people in the [place] die from flu, and many more are hospitalized. Flu vaccine prevents millions of illnesses and flu-related visits to the doctor each year. 2. Influenza vaccine CDC recommends everyone 6 months of age and older get vaccinated every flu season. Children 6 months through 8 years of age may need 2 doses during a single flu season. Everyone else needs only 1 dose each flu season. It takes about 2 weeks for protection to develop after vaccination. There are many flu viruses, and they  are always changing. Each year a new flu vaccine is made to protect against three or four viruses that are likely to cause disease in the upcoming flu season. Even when the vaccine doesn't exactly match these viruses, it may still provide some protection. Influenza vaccine does not cause flu. Influenza vaccine may be given at the same time as other vaccines. 3. Talk with your health care provider Tell your vaccine provider if the person getting the vaccine:  Has had an allergic reaction after a previous dose of influenza vaccine, or has any severe, life-threatening allergies.  Has ever had Guillain-Barr Syndrome (also called GBS). In some cases, your health care provider may decide to postpone influenza vaccination to a future visit. People with minor illnesses, such as a cold, may be vaccinated. People who are moderately or severely ill should usually wait until they recover before getting influenza vaccine. Your health care provider can give you more information. 4. Risks of a vaccine reaction  Soreness, redness, and swelling where shot is given, fever, muscle aches, and headache can happen after influenza vaccine.  There may be a very small increased risk of Guillain-Barr Syndrome (GBS) after inactivated influenza vaccine (the flu shot). Young children who get the flu shot along with pneumococcal vaccine (PCV13), and/or DTaP vaccine at the same time might be slightly more likely to have a seizure caused by fever. Tell your health care provider if a child who is getting flu vaccine has ever had a seizure. People sometimes faint after medical procedures, including vaccination. Tell your provider if you feel dizzy or have vision changes or ringing in the ears. As with any medicine, there is a very remote chance of a vaccine causing a severe allergic reaction, other serious injury, or death. 5. What if there is a serious problem? An allergic reaction could occur after the vaccinated person  leaves the clinic. If you see signs of a severe allergic reaction (hives, swelling of the face and throat, difficulty breathing, a fast heartbeat, dizziness, or weakness), call 9-1-1 and get the person to the nearest hospital. For other signs that concern you, call your health care provider. Adverse reactions should be reported to the Vaccine Adverse Event Reporting System (VAERS). Your health care provider will usually file this report, or you can do it yourself. Visit the VAERS website at www.vaers.hhs.gov or call 1-800-822-7967.VAERS is only for reporting reactions, and VAERS staff do not give medical advice. 6. The National Vaccine Injury Compensation Program The National Vaccine Injury Compensation Program (VICP) is a federal program that was created to compensate people who may have been injured by certain vaccines. Visit the VICP website at www.hrsa.gov/vaccinecompensation or call 1-800-338-2382 to learn about the   program and about filing a claim. There is a time limit to file a claim for compensation. 7. How can I learn more?  Ask your healthcare provider.  Call your local or state health department.  Contact the Centers for Disease Control and Prevention (CDC): ? Call 845 199 5269 (1-800-CDC-INFO) or ? Visit CDC's https://gibson.com/ Vaccine Information Statement (Interim) Inactivated Influenza Vaccine (12/30/2017) This information is not intended to replace advice given to you by your health care provider. Make sure you discuss any questions you have with your health care provider. Document Released: 02/26/2006 Document Revised: 08/23/2018 Document Reviewed: 01/03/2018 Elsevier Patient Education  2020 Elsevier Inc. Preventive Care 72-89 Years Old, Female Preventive care refers to visits with your health care provider and lifestyle choices that can promote health and wellness. This includes:  A yearly physical exam. This may also be called an annual well check.  Regular dental visits  and eye exams.  Immunizations.  Screening for certain conditions.  Healthy lifestyle choices, such as eating a healthy diet, getting regular exercise, not using drugs or products that contain nicotine and tobacco, and limiting alcohol use. What can I expect for my preventive care visit? Physical exam Your health care provider will check your:  Height and weight. This may be used to calculate body mass index (BMI), which tells if you are at a healthy weight.  Heart rate and blood pressure.  Skin for abnormal spots. Counseling Your health care provider may ask you questions about your:  Alcohol, tobacco, and drug use.  Emotional well-being.  Home and relationship well-being.  Sexual activity.  Eating habits.  Work and work Statistician.  Method of birth control.  Menstrual cycle.  Pregnancy history. What immunizations do I need?  Influenza (flu) vaccine  This is recommended every year. Tetanus, diphtheria, and pertussis (Tdap) vaccine  You may need a Td booster every 10 years. Varicella (chickenpox) vaccine  You may need this if you have not been vaccinated. Zoster (shingles) vaccine  You may need this after age 25. Measles, mumps, and rubella (MMR) vaccine  You may need at least one dose of MMR if you were born in 1957 or later. You may also need a second dose. Pneumococcal conjugate (PCV13) vaccine  You may need this if you have certain conditions and were not previously vaccinated. Pneumococcal polysaccharide (PPSV23) vaccine  You may need one or two doses if you smoke cigarettes or if you have certain conditions. Meningococcal conjugate (MenACWY) vaccine  You may need this if you have certain conditions. Hepatitis A vaccine  You may need this if you have certain conditions or if you travel or work in places where you may be exposed to hepatitis A. Hepatitis B vaccine  You may need this if you have certain conditions or if you travel or work in  places where you may be exposed to hepatitis B. Haemophilus influenzae type b (Hib) vaccine  You may need this if you have certain conditions. Human papillomavirus (HPV) vaccine  If recommended by your health care provider, you may need three doses over 6 months. You may receive vaccines as individual doses or as more than one vaccine together in one shot (combination vaccines). Talk with your health care provider about the risks and benefits of combination vaccines. What tests do I need? Blood tests  Lipid and cholesterol levels. These may be checked every 5 years, or more frequently if you are over 25 years old.  Hepatitis C test.  Hepatitis B test. Screening  Lung  cancer screening. You may have this screening every year starting at age 76 if you have a 30-pack-year history of smoking and currently smoke or have quit within the past 15 years.  Colorectal cancer screening. All adults should have this screening starting at age 30 and continuing until age 60. Your health care provider may recommend screening at age 70 if you are at increased risk. You will have tests every 1-10 years, depending on your results and the type of screening test.  Diabetes screening. This is done by checking your blood sugar (glucose) after you have not eaten for a while (fasting). You may have this done every 1-3 years.  Mammogram. This may be done every 1-2 years. Talk with your health care provider about when you should start having regular mammograms. This may depend on whether you have a family history of breast cancer.  BRCA-related cancer screening. This may be done if you have a family history of breast, ovarian, tubal, or peritoneal cancers.  Pelvic exam and Pap test. This may be done every 3 years starting at age 23. Starting at age 15, this may be done every 5 years if you have a Pap test in combination with an HPV test. Other tests  Sexually transmitted disease (STD) testing.  Bone density  scan. This is done to screen for osteoporosis. You may have this scan if you are at high risk for osteoporosis. Follow these instructions at home: Eating and drinking  Eat a diet that includes fresh fruits and vegetables, whole grains, lean protein, and low-fat dairy.  Take vitamin and mineral supplements as recommended by your health care provider.  Do not drink alcohol if: ? Your health care provider tells you not to drink. ? You are pregnant, may be pregnant, or are planning to become pregnant.  If you drink alcohol: ? Limit how much you have to 0-1 drink a day. ? Be aware of how much alcohol is in your drink. In the [place] one drink equals one 12 oz bottle of beer (355 mL), one 5 oz glass of wine (148 mL), or one 1 oz glass of hard liquor (44 mL). Lifestyle  Take daily care of your teeth and gums.  Stay active. Exercise for at least 30 minutes on 5 or more days each week.  Do not use any products that contain nicotine or tobacco, such as cigarettes, e-cigarettes, and chewing tobacco. If you need help quitting, ask your health care provider.  If you are sexually active, practice safe sex. Use a condom or other form of birth control (contraception) in order to prevent pregnancy and STIs (sexually transmitted infections).  If told by your health care provider, take low-dose aspirin daily starting at age 24. What's next?  Visit your health care provider once a year for a well check visit.  Ask your health care provider how often you should have your eyes and teeth checked.  Stay up to date on all vaccines. This information is not intended to replace advice given to you by your health care provider. Make sure you discuss any questions you have with your health care provider. Document Released: 05/31/2015 Document Revised: 01/13/2018 Document Reviewed: 01/13/2018 Elsevier Patient Education  2020 [place] self-awareness is knowing how your breasts  look and feel. Doing breast self-awareness is important. It allows you to catch a breast problem early while it is still small and can be treated. All women should do breast self-awareness, including women who have had  breast implants. Tell your doctor if you notice a change in your breasts. What you need:  A mirror.  A well-lit room. How to do a breast self-exam A breast self-exam is one way to learn what is normal for your breasts and to check for changes. To do a breast self-exam: Look for changes  1. Take off all the clothes above your waist. 2. Stand in front of a mirror in a room with good lighting. 3. Put your hands on your hips. 4. Push your hands down. 5. Look at your breasts and nipples in the mirror to see if one breast or nipple looks different from the other. Check to see if: ? The shape of one breast is different. ? The size of one breast is different. ? There are wrinkles, dips, and bumps in one breast and not the other. 6. Look at each breast for changes in the skin, such as: ? Redness. ? Scaly areas. 7. Look for changes in your nipples, such as: ? Liquid around the nipples. ? Bleeding. ? Dimpling. ? Redness. ? A change in where the nipples are. Feel for changes  1. Lie on your back on the floor. 2. Feel each breast. To do this, follow these steps: ? Pick a breast to feel. ? Put the arm closest to that breast above your head. ? Use your other arm to feel the nipple area of your breast. Feel the area with the pads of your three middle fingers by making small circles with your fingers. For the first circle, press lightly. For the second circle, press harder. For the third circle, press even harder. ? Keep making circles with your fingers at the different pressures as you move down your breast. Stop when you feel your ribs. ? Move your fingers a little toward the center of your body. ? Start making circles with your fingers again, this time going up until you reach  your collarbone. ? Keep making up-and-down circles until you reach your armpit. Remember to keep using the three pressures. ? Feel the other breast in the same way. 3. Sit or stand in the tub or shower. 4. With soapy water on your skin, feel each breast the same way you did in step 2 when you were lying on the floor. Write down what you find Writing down what you find can help you remember what to tell your doctor. Write down:  What is normal for each breast.  Any changes you find in each breast, including: ? The kind of changes you find. ? Whether you have pain. ? Size and location of any lumps.  When you last had your menstrual period. General tips  Check your breasts every month.  If you are breastfeeding, the best time to check your breasts is after you feed your baby or after you use a breast pump.  If you get menstrual periods, the best time to check your breasts is 5-7 days after your menstrual period is over.  With time, you will become comfortable with the self-exam, and you will begin to know if there are changes in your breasts. Contact a doctor if you:  See a change in the shape or size of your breasts or nipples.  See a change in the skin of your breast or nipples, such as red or scaly skin.  Have fluid coming from your nipples that is not normal.  Find a lump or thick area that was not there before.  Have pain in  your breasts.  Have any concerns about your breast health. Summary  Breast self-awareness includes looking for changes in your breasts, as well as feeling for changes within your breasts.  Breast self-awareness should be done in front of a mirror in a well-lit room.  You should check your breasts every month. If you get menstrual periods, the best time to check your breasts is 5-7 days after your menstrual period is over.  Let your doctor know of any changes you see in your breasts, including changes in size, changes on the skin, pain or  tenderness, or fluid from your nipples that is not normal. This information is not intended to replace advice given to you by your health care provider. Make sure you discuss any questions you have with your health care provider. Document Released: 10/21/2007 Document Revised: 12/21/2017 Document Reviewed: 12/21/2017 Elsevier Patient Education  2020 [employer].

## 2019-02-24 ENCOUNTER — Other Ambulatory Visit: Payer: Self-pay

## 2019-02-24 ENCOUNTER — Telehealth: Payer: Self-pay | Admitting: Pulmonary Disease

## 2019-02-24 NOTE — Telephone Encounter (Signed)
Pt aware of date/time of covid test,

## 2019-02-28 ENCOUNTER — Other Ambulatory Visit: Payer: Self-pay

## 2019-02-28 ENCOUNTER — Other Ambulatory Visit
Admission: RE | Admit: 2019-02-28 | Discharge: 2019-02-28 | Disposition: A | Discharge status: Home / Self Care | Payer: BC Managed Care – PPO | Source: Ambulatory Visit | Attending: Pulmonary Disease | Admitting: Pulmonary Disease

## 2019-02-28 DIAGNOSIS — Z01812 Encounter for preprocedural laboratory examination: Secondary | ICD-10-CM | POA: Insufficient documentation

## 2019-02-28 DIAGNOSIS — Z20828 Contact with and (suspected) exposure to other viral communicable diseases: Secondary | ICD-10-CM | POA: Insufficient documentation

## 2019-02-28 LAB — SARS CORONAVIRUS 2 (TAT 6-24 HRS): SARS Coronavirus 2: NEGATIVE

## 2019-03-01 ENCOUNTER — Encounter: Payer: Self-pay | Admitting: Pulmonary Disease

## 2019-03-01 ENCOUNTER — Ambulatory Visit: Payer: BC Managed Care – PPO | Admitting: Pulmonary Disease

## 2019-03-01 ENCOUNTER — Ambulatory Visit: Payer: BC Managed Care – PPO | Attending: Pulmonary Disease

## 2019-03-01 ENCOUNTER — Ambulatory Visit: Payer: BC Managed Care – PPO

## 2019-03-01 VITALS — BP 124/88 | HR 89 | Temp 98.6°F | Ht 64.0 in | Wt 242.2 lb

## 2019-03-01 DIAGNOSIS — R0602 Shortness of breath: Secondary | ICD-10-CM | POA: Insufficient documentation

## 2019-03-01 DIAGNOSIS — J4520 Mild intermittent asthma, uncomplicated: Secondary | ICD-10-CM

## 2019-03-01 MED ORDER — ALBUTEROL SULFATE (2.5 MG/3ML) 0.083% IN NEBU
2.5000 mg | INHALATION_SOLUTION | Freq: Once | RESPIRATORY_TRACT | Status: AC
  Administered 2019-03-01: 2.5 mg via RESPIRATORY_TRACT
  Filled 2019-03-01: qty 3

## 2019-03-01 NOTE — Patient Instructions (Signed)
Follow-up as needed.  Continue follow-up with Dr. Tami Ribas.

## 2019-03-01 NOTE — Progress Notes (Signed)
    Assessment & Plan:  There are no diagnoses linked to this encounter.  Patient Instructions  Follow-up as needed.  Continue follow-up with Dr. Herminio.  Please note: late entry documentation due to logistical difficulties during COVID-19 pandemic. This note is filed for information purposes only, and is not intended to be used for billing, nor does it represent the full scope/nature of the visit in question. Please see any associated scanned media linked to date of encounter for additional pertinent information.  Subjective:    HPI: Hannah Chang is a 53 y.o. female presenting to the pulmonology clinic on 03/01/2019 with report of: Follow-up (PFT. She reports her wheezing has improved some. She saw Dr. Herminio for allergy testing and found out lots of things she was allergic too. )     Outpatient Encounter Medications as of 03/01/2019  Medication Sig Note   [EXPIRED] beclomethasone (QVAR  REDIHALER) 80 MCG/ACT inhaler Inhale 2 puffs into the lungs 2 (two) times daily.    fexofenadine (ALLEGRA) 180 MG tablet Take 180 mg by mouth as needed for allergies or rhinitis.    fluticasone  (FLONASE ) 50 MCG/ACT nasal spray Place into both nostrils daily.    ipratropium (ATROVENT ) 0.02 % nebulizer solution Take 2.5 mLs (0.5 mg total) by nebulization every 6 (six) hours as needed for wheezing or shortness of breath.    levalbuterol (XOPENEX HFA) 45 MCG/ACT inhaler Inhale 1-2 puffs into the lungs every 4 (four) hours as needed for wheezing.    Respiratory Therapy Supplies (NEBULIZER) DEVI 1 Device by Does not apply route 2 (two) times daily as needed.    [DISCONTINUED] levonorgestrel -ethinyl estradiol (AVIANE) 0.1-20 MG-MCG tablet Take 1 tablet by mouth daily.    [DISCONTINUED] valACYclovir  (VALTREX ) 1000 MG tablet Take 1 tablet (1,000 mg total) by mouth 3 (three) times daily. Take at first sign of rash 01/08/2016: Wants to know if she can be on a low dose to suppress shingles, [facility]    No facility-administered encounter medications on file as of 03/01/2019.      Objective:   Vitals:   03/01/19 1136  BP: 124/88  Pulse: 89  Temp: 98.6 F (37 C)  Height: 5' 4 (1.626 m)  Weight: 242 lb 3.2 oz (109.9 kg)  SpO2: 97%  TempSrc: Temporal  BMI (Calculated): 41.55     Physical exam documentation is limited by delayed entry of information.

## 2019-03-03 ENCOUNTER — Other Ambulatory Visit: Payer: Self-pay

## 2019-03-03 DIAGNOSIS — Z3041 Encounter for surveillance of contraceptive pills: Secondary | ICD-10-CM

## 2019-03-03 MED ORDER — LEVONORGESTREL-ETHINYL ESTRAD 0.1-20 MG-MCG PO TABS: 1.0000 | ORAL_TABLET | Freq: Every day | ORAL | 11 refills | Status: DC

## 2019-03-06 ENCOUNTER — Other Ambulatory Visit: Payer: Self-pay

## 2019-03-06 DIAGNOSIS — Z3041 Encounter for surveillance of contraceptive pills: Secondary | ICD-10-CM

## 2019-03-06 MED ORDER — LEVONORGESTREL-ETHINYL ESTRAD 0.1-20 MG-MCG PO TABS: 1.0000 | ORAL_TABLET | Freq: Every day | ORAL | 17 refills | Status: DC

## 2019-03-07 LAB — CYTOLOGY - PAP
Comment: NEGATIVE
Comment: NEGATIVE
HPV 16: NEGATIVE
HPV 18 / 45: NEGATIVE
High risk HPV: POSITIVE — AB

## 2019-07-09 ENCOUNTER — Other Ambulatory Visit: Payer: Self-pay | Admitting: Obstetrics and Gynecology

## 2019-07-09 DIAGNOSIS — Z3041 Encounter for surveillance of contraceptive pills: Secondary | ICD-10-CM

## 2019-12-19 ENCOUNTER — Ambulatory Visit: Payer: BC Managed Care – PPO | Admitting: Family Medicine

## 2019-12-19 ENCOUNTER — Ambulatory Visit (INDEPENDENT_AMBULATORY_CARE_PROVIDER_SITE_OTHER): Payer: BC Managed Care – PPO

## 2019-12-19 ENCOUNTER — Other Ambulatory Visit: Payer: Self-pay

## 2019-12-19 ENCOUNTER — Encounter: Payer: Self-pay | Admitting: Family Medicine

## 2019-12-19 VITALS — BP 120/80 | HR 95 | Ht 64.0 in | Wt 239.0 lb

## 2019-12-19 DIAGNOSIS — M542 Cervicalgia: Secondary | ICD-10-CM

## 2019-12-19 DIAGNOSIS — M5030 Other cervical disc degeneration, unspecified cervical region: Secondary | ICD-10-CM | POA: Diagnosis not present

## 2019-12-19 MED ORDER — GABAPENTIN 100 MG PO CAPS: 200.0000 mg | ORAL_CAPSULE | Freq: Every day | ORAL | 3 refills | Status: AC

## 2019-12-19 NOTE — Assessment & Plan Note (Addendum)
Degenerative disc disease of the cervical spine is likely.  New x-rays ordered today.  We will see if there is any changes.  We discussed with patient on gabapentin.  Patient would like to try to keep his conservative as possible.  Home exercises given today and patient will work with Event organiser.  Will consider osteopathic manipulation at follow-up.  Follow-up again in 4 to 8 weeks if no improvement will also consider ultrasound of shoulders but highly unlikely

## 2019-12-19 NOTE — Progress Notes (Signed)
[facility] [address] [place] [address] Phone: [phone] Subjective:   I Ronelle Nigh am serving as a Neurosurgeon for Dr. Antoine Primas.  This visit occurred during the SARS-CoV-2 public health emergency.  Safety protocols were in place, including screening questions prior to the visit, additional usage of staff PPE, and extensive cleaning of exam room while observing appropriate contact time as indicated for disinfecting solutions.   I'm seeing this patient by the request  of:  Ethelda Chick, MD  CC: Neck and shoulder pain follow-up  [id number]  Hannah Chang is a 54 y.o. female coming in with complaint of neck and shoulder pain. Patient states that it feels like she slept wrong. Has had 3 massages and states her pain comes back. Left sided neck pain. Has gone to 4 chiropractor appointments as well. Numbness and tingling in the fingertips this morning. History of carpal and cubital tunnel issues. States that in the beginning she did have a loss of ROM. Using biofreeze and aleve for pain. Most pain is cervical flexion. Overall ROM is better. Pain radiates down the shoulder blade. Patient only remembers changing her purse. She states that she puts less in it now and tries to baby the left arm.       Past Medical History:  Diagnosis Date  . Environmental allergies   . Exercise-induced asthma   . Migraines   . Shingles    Past Surgical History:  Procedure Laterality Date  . CESAREAN SECTION  1992  . EYE SURGERY    . NASAL SEPTUM SURGERY     X 2  . TONSILLECTOMY     Social History   Socioeconomic History  . Marital status: Divorced    Spouse name: Not on file  . Number of children: Not on file  . Years of education: Not on file  . Highest education level: Not on file  Occupational History  . Not on file  Tobacco Use  . Smoking status: Never Smoker  . Smokeless tobacco: Never Used  Vaping Use  . Vaping Use: Never used    Substance and Sexual Activity  . Alcohol use: Yes    Comment: ONCE A MONTH  . Drug use: No  . Sexual activity: Not Currently    Birth control/protection: None, Pill  Other Topics Concern  . Not on file  Social History Narrative  . Not on file   Social Determinants of Health   Financial Resource Strain:   . Difficulty of Paying Living Expenses:   Food Insecurity:   . Worried About Programme researcher, broadcasting/film/video in the Last Year:   . Barista in the Last Year:   Transportation Needs:   . Freight forwarder (Medical):   Marland Kitchen Lack of Transportation (Non-Medical):   Physical Activity:   . Days of Exercise per Week:   . Minutes of Exercise per Session:   Stress:   . Feeling of Stress :   Social Connections:   . Frequency of Communication with Friends and Family:   . Frequency of Social Gatherings with Friends and Family:   . Attends Religious Services:   . Active Member of Clubs or Organizations:   . Attends Banker Meetings:   Marland Kitchen Marital Status:    Allergies  Allergen Reactions  . Pistachio Nut (Diagnostic) Itching    Internal itch   . Prednisone Cough    Muscle spasms in chest  . Cephalexin Other (See  Comments)    REACTION: whelps REACTION: whelps  . Doxycycline Hyclate     REACTION: as child  . Doxycycline Hyclate Other (See Comments)    REACTION: as child  . Erythromycin Other (See Comments)    REACTION: as child REACTION: as child  . Sulfa Antibiotics Other (See Comments)    Because of the reaction she had with Keflex  Because of the reaction she had with Keflex  UNKNOWN   Family History  Problem Relation Age of Onset  . Breast cancer Mother 12  . Breast cancer Maternal Aunt 69    Current Outpatient Medications (Endocrine & Metabolic):  Marland Kitchen  AVIANE 0.1-20 MG-MCG tablet, Take 1 tablet by mouth once daily   Current Outpatient Medications (Respiratory):  .  beclomethasone (QVAR REDIHALER) 80 MCG/ACT inhaler, Inhale 2 puffs into the lungs 2 (two)  times daily. .  fexofenadine (ALLEGRA) 180 MG tablet, Take 180 mg by mouth as needed for allergies or rhinitis. .  fluticasone (FLONASE) 50 MCG/ACT nasal spray, Place into both nostrils daily. Marland Kitchen  ipratropium (ATROVENT) 0.02 % nebulizer solution, Take 2.5 mLs (0.5 mg total) by nebulization every 6 (six) hours as needed for wheezing or shortness of breath. .  levalbuterol (XOPENEX HFA) 45 MCG/ACT inhaler, Inhale 1-2 puffs into the lungs every 4 (four) hours as needed for wheezing.    Current Outpatient Medications (Other):  Marland Kitchen  Respiratory Therapy Supplies (NEBULIZER) DEVI, 1 Device by Does not apply route 2 (two) times daily as needed. .  valACYclovir (VALTREX) 1000 MG tablet, Take 1 tablet (1,000 mg total) by mouth 3 (three) times daily. Take at first sign of rash   Reviewed prior external information including notes and imaging from  primary care provider As well as notes that were available from care everywhere and other healthcare systems.  Past medical history, social, surgical and family history all reviewed in electronic medical record.  No pertanent information unless stated regarding to the chief complaint.   Review of Systems:  No headache, visual changes, nausea, vomiting, diarrhea, constipation, dizziness, abdominal pain, skin rash, fevers, chills, night sweats, weight loss, swollen lymph nodes, body aches, joint swelling, chest pain, shortness of breath, mood changes. POSITIVE muscle aches  Objective  There were no vitals taken for this visit.   General: No apparent distress alert and oriented x3 mood and affect normal, dressed appropriately.  HEENT: Pupils equal, extraocular movements intact  Respiratory: Patient's speak in full sentences and does not appear short of breath  Cardiovascular: No lower extremity edema, non tender, no erythema  Neuro: Cranial nerves II through XII are intact, neurovascularly intact in all extremities with 2+ DTRs and 2+ pulses.  Gait normal with  good balance and coordination.  MSK:  Non tender with full range of motion and good stability and symmetric strength and tone of elbows, wrist, hip, knee and ankles bilaterally.  Very mild impingement of the shoulder noted with Hawkins and Neer's bilaterally. Neck exam shows the patient does have some loss of lordosis.  Worsening pain actually with flexion of the neck.  Does have full range of motion but mild instability is noted with radicular symptoms on the scapula on the left side mild down the left arm but very nonspecific distribution patient does have a scar from pain.  Neck surgery previously  [id number]; 15 additional minutes spent for Therapeutic exercises as stated in above notes.  This included exercises focusing on stretching, strengthening, with significant focus on eccentric aspects.   Long term goals  include an improvement in range of motion, strength, endurance as well as avoiding reinjury. Patient's frequency would include in 1-2 times a day, 3-5 times a week for a duration of 6-12 weeks. Exercises that included:  Basic scapular stabilization to include adduction and depression of scapula Scaption, focusing on proper movement and good control Internal and External rotation utilizing a theraband, with elbow tucked at side entire time Rows with theraband    Proper technique shown and discussed handout in great detail with ATC.  All questions were discussed and answered.     Impression and Recommendations:     The above documentation has been reviewed and is accurate and complete Judi Saa, DO       Note: This dictation was prepared with Dragon dictation along with smaller phrase technology. Any transcriptional errors that result from this process are unintentional.

## 2019-12-19 NOTE — Patient Instructions (Signed)
Good to see you Scapular exercises Vitamin D 2,000 IUs Gabapentin 200 mg at night Ice 20 mins 2 times a day  Heels butt shoulders and head on wall for 5 mins a day  See me again in 5 weeks

## 2020-01-08 ENCOUNTER — Ambulatory Visit: Payer: BC Managed Care – PPO | Admitting: Family Medicine

## 2020-01-09 ENCOUNTER — Ambulatory Visit: Payer: BC Managed Care – PPO | Admitting: Family Medicine

## 2020-01-09 ENCOUNTER — Telehealth: Payer: Self-pay

## 2020-01-09 ENCOUNTER — Telehealth: Payer: Self-pay | Admitting: *Deleted

## 2020-01-09 ENCOUNTER — Other Ambulatory Visit: Payer: Self-pay

## 2020-01-09 ENCOUNTER — Encounter: Payer: Self-pay | Admitting: Family Medicine

## 2020-01-09 VITALS — BP 132/92 | HR 86 | Ht 64.0 in | Wt 237.0 lb

## 2020-01-09 DIAGNOSIS — M999 Biomechanical lesion, unspecified: Secondary | ICD-10-CM

## 2020-01-09 DIAGNOSIS — M5030 Other cervical disc degeneration, unspecified cervical region: Secondary | ICD-10-CM | POA: Diagnosis not present

## 2020-01-09 MED ORDER — MELOXICAM 15 MG PO TABS: 15.0000 mg | ORAL_TABLET | Freq: Every day | ORAL | 0 refills | Status: AC

## 2020-01-09 NOTE — Patient Instructions (Addendum)
Meloxicam 15 mg for next 10 days Continue gabapentin See me in 5-6 weeks PT Roseanne Reno Consider starting OMT next visit

## 2020-01-09 NOTE — Assessment & Plan Note (Addendum)
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  Patient does have some mild scapular dyskinesis and will send to formal physical therapy.  Discussed which activities to do which wants to avoid.  Increase activity as tolerated.  Follow-up again in 4 to 8 weeks if patient does much better we will consider the possibility of manipulation

## 2020-01-09 NOTE — Progress Notes (Signed)
[facility] [address] [place] [address] Phone: [phone] Subjective:   I Hannah Chang am serving as a Neurosurgeon for Dr. Antoine Primas.  This visit occurred during the SARS-CoV-2 public health emergency.  Safety protocols were in place, including screening questions prior to the visit, additional usage of staff PPE, and extensive cleaning of exam room while observing appropriate contact time as indicated for disinfecting solutions.   I'm seeing this patient by the request  of:  Ethelda Chick, MD  CC: Neck pain follow-up  [id number]   12/19/2019 Degenerative disc disease of the cervical spine is likely.  New x-rays ordered today.  We will see if there is any changes.  We discussed with patient on gabapentin.  Patient would like to try to keep his conservative as possible.  Home exercises given today and patient will work with Event organiser.  Will consider osteopathic manipulation at follow-up.  Follow-up again in 4 to 8 weeks if no improvement will also consider ultrasound of shoulders but highly unlikely  Update 01/09/2020 Hannah Chang is a 54 y.o. female coming in with complaint of neck pain. States that her pain has been bad lately. Still taking gabapentin in order to sleep at night. States she went on vacation about 2 weeks ago and believes her job is causing the pain.  Patient states that when she was on vacation really had minimal pain and now that she is back at the work she has had a couple nights where she was in severe amount of pain she most consider going to the emergency room.     Past Medical History:  Diagnosis Date  . Environmental allergies   . Exercise-induced asthma   . Migraines   . Shingles    Past Surgical History:  Procedure Laterality Date  . CESAREAN SECTION  1992  . EYE SURGERY    . NASAL SEPTUM SURGERY     X 2  . TONSILLECTOMY     Social History   Socioeconomic History  . Marital status: Divorced     Spouse name: Not on file  . Number of children: Not on file  . Years of education: Not on file  . Highest education level: Not on file  Occupational History  . Not on file  Tobacco Use  . Smoking status: Never Smoker  . Smokeless tobacco: Never Used  Vaping Use  . Vaping Use: Never used  Substance and Sexual Activity  . Alcohol use: Yes    Comment: ONCE A MONTH  . Drug use: No  . Sexual activity: Not Currently    Birth control/protection: None, Pill  Other Topics Concern  . Not on file  Social History Narrative  . Not on file   Social Determinants of Health   Financial Resource Strain:   . Difficulty of Paying Living Expenses: Not on file  Food Insecurity:   . Worried About Programme researcher, broadcasting/film/video in the Last Year: Not on file  . Ran Out of Food in the Last Year: Not on file  Transportation Needs:   . Lack of Transportation (Medical): Not on file  . Lack of Transportation (Non-Medical): Not on file  Physical Activity:   . Days of Exercise per Week: Not on file  . Minutes of Exercise per Session: Not on file  Stress:   . Feeling of Stress : Not on file  Social Connections:   . Frequency of Communication with Friends and Family: Not on  file  . Frequency of Social Gatherings with Friends and Family: Not on file  . Attends Religious Services: Not on file  . Active Member of Clubs or Organizations: Not on file  . Attends Banker Meetings: Not on file  . Marital Status: Not on file   Allergies  Allergen Reactions  . Pistachio Nut (Diagnostic) Itching    Internal itch   . Prednisone Cough    Muscle spasms in chest  . Cephalexin Other (See Comments)    REACTION: whelps REACTION: whelps  . Doxycycline Hyclate     REACTION: as child  . Doxycycline Hyclate Other (See Comments)    REACTION: as child  . Erythromycin Other (See Comments)    REACTION: as child REACTION: as child  . Sulfa Antibiotics Other (See Comments)    Because of the reaction she had with  Keflex  Because of the reaction she had with Keflex  UNKNOWN   Family History  Problem Relation Age of Onset  . Breast cancer Mother 79  . Breast cancer Maternal Aunt 69    Current Outpatient Medications (Endocrine & Metabolic):  Marland Kitchen  AVIANE 0.1-20 MG-MCG tablet, Take 1 tablet by mouth once daily   Current Outpatient Medications (Respiratory):  .  fexofenadine (ALLEGRA) 180 MG tablet, Take 180 mg by mouth as needed for allergies or rhinitis. .  fluticasone (FLONASE) 50 MCG/ACT nasal spray, Place into both nostrils daily. Marland Kitchen  ipratropium (ATROVENT) 0.02 % nebulizer solution, Take 2.5 mLs (0.5 mg total) by nebulization every 6 (six) hours as needed for wheezing or shortness of breath. .  levalbuterol (XOPENEX HFA) 45 MCG/ACT inhaler, Inhale 1-2 puffs into the lungs every 4 (four) hours as needed for wheezing.  Current Outpatient Medications (Analgesics):  .  meloxicam (MOBIC) 15 MG tablet, Take 1 tablet (15 mg total) by mouth daily.   Current Outpatient Medications (Other):  .  gabapentin (NEURONTIN) 100 MG capsule, Take 2 capsules (200 mg total) by mouth at bedtime. Marland Kitchen  Respiratory Therapy Supplies (NEBULIZER) DEVI, 1 Device by Does not apply route 2 (two) times daily as needed. .  valACYclovir (VALTREX) 1000 MG tablet, Take 1 tablet (1,000 mg total) by mouth 3 (three) times daily. Take at first sign of rash   Reviewed prior external information including notes and imaging from  primary care provider As well as notes that were available from care everywhere and other healthcare systems.  Past medical history, social, surgical and family history all reviewed in electronic medical record.  No pertanent information unless stated regarding to the chief complaint.   Review of Systems:  No headache, visual changes, nausea, vomiting, diarrhea, constipation, dizziness, abdominal pain, skin rash, fevers, chills, night sweats, weight loss, swollen lymph nodes, body aches, joint swelling, chest  pain, shortness of breath, mood changes. POSITIVE muscle aches  Objective  Blood pressure (!) 132/92, pulse 86, height 5\' 4"  (1.626 m), weight 237 lb (107.5 kg), SpO2 98 %.   General: No apparent distress alert and oriented x3 mood and affect normal, dressed appropriately.  HEENT: Pupils equal, extraocular movements intact  Respiratory: Patient's speak in full sentences and does not appear short of breath  Cardiovascular: No lower extremity edema, non tender, no erythema  Neuro: Cranial nerves II through XII are intact, neurovascularly intact in all extremities with 2+ DTRs and 2+ pulses.  Gait normal with good balance and coordination.  MSK:   Very mild tightness of the shoulders bilaterally.  Neck exam does have some mild loss  of lordosis, some tenderness to palpation in the paraspinal musculature of the neck as well as the parascapular region noted.  Osteopathic findings T4 extended rotated and side bent right  T7 extended rotated and side bent left    Impression and Recommendations:     The above documentation has been reviewed and is accurate and complete Judi Saa, DO       Note: This dictation was prepared with Dragon dictation along with smaller phrase technology. Any transcriptional errors that result from this process are unintentional.

## 2020-01-09 NOTE — Telephone Encounter (Signed)
Pt called stating at her OV this morning she discussed with Dr. Katrinka Blazing about getting a letter for her to get a certain kind of desk for work. Pt did not receive the letter but would like it released to mychart. I sent pt an activation code so she will activate mychart this afternoon so the letter can be released.

## 2020-01-09 NOTE — Assessment & Plan Note (Signed)
   Decision today to treat with OMT was based on Physical Exam  After verbal consent patient was treated with HVLA, ME, FPR techniques in thoracic,areas, all areas are chronic   Patient tolerated the procedure well with improvement in symptoms  Patient given exercises, stretches and lifestyle modifications  See medications in patient instructions if given  Patient will follow up in 4-8 weeks

## 2020-01-09 NOTE — Telephone Encounter (Signed)
Called to let patient know letter for work was sent through [employer].

## 2020-01-09 NOTE — Telephone Encounter (Signed)
Called patient right after her visit a few hours ago notifying that the note has already been sent to MyChart. When she activates she should see it.

## 2020-01-23 ENCOUNTER — Telehealth: Payer: Self-pay | Admitting: Family Medicine

## 2020-01-23 NOTE — Telephone Encounter (Signed)
Spoke with patient. Told her to see how she is feeling and then she can cancel if better after speaking with PCP.

## 2020-01-23 NOTE — Telephone Encounter (Signed)
Pt seen recently for neck/shoulder pain. She has just realized that she is having a Shingles outbreak and it thinking the neck/shoulder pain is nerve pain related to the Shingles. She is in contact with her PCP.  Should she keep her 10/1 follow up appt? Not sure if we are able to help with pain related to Shingles.

## 2020-01-24 ENCOUNTER — Ambulatory Visit: Payer: BC Managed Care – PPO | Admitting: Family Medicine

## 2020-02-15 NOTE — Progress Notes (Signed)
  [facility] [address] [place] [address] Phone: [phone] Subjective:   I Ronelle Nigh am serving as a Neurosurgeon for Dr. Antoine Primas.  This visit occurred during the SARS-CoV-2 public health emergency.  Safety protocols were in place, including screening questions prior to the visit, additional usage of staff PPE, and extensive cleaning of exam room while observing appropriate contact time as indicated for disinfecting solutions.   I'm seeing this patient by the request  of:  Ethelda Chick, MD  CC: Neck pain follow-up  [id number]  Hannah Chang is a 54 y.o. female coming in with complaint of back and neck pain. OMT 01/09/2020. Patient states she is in pain today. Had shingles and hasn't been to PT. Found out the day she was supposed to start. States she hurts every morning. Believes it is due to her first shingles injection July 10th.   Medications patient has been prescribed: Gabapentin and Meloxicam  Taking: Yes cannot take the gabapentin during the day secondary to somnolence         Reviewed prior external information including notes and imaging from previsou exam, outside providers and external EMR if available.   As well as notes that were available from care everywhere and other healthcare systems.  Past medical history, social, surgical and family history all reviewed in electronic medical record.  No pertanent information unless stated regarding to the chief complaint.   Past Medical History:  Diagnosis Date  . Environmental allergies   . Exercise-induced asthma   . Migraines   . Shingles     Allergies  Allergen Reactions  . Pistachio Nut (Diagnostic) Itching    Internal itch   . Prednisone Cough    Muscle spasms in chest  . Cephalexin Other (See Comments)    REACTION: whelps REACTION: whelps  . Doxycycline Hyclate     REACTION: as child  . Doxycycline Hyclate Other (See Comments)    REACTION: as child  .  Erythromycin Other (See Comments)    REACTION: as child REACTION: as child  . Sulfa Antibiotics Other (See Comments)    Because of the reaction she had with Keflex  Because of the reaction she had with Keflex  UNKNOWN     Review of Systems:  No headache, visual changes, nausea, vomiting, diarrhea, constipation, dizziness, abdominal pain, skin rash, fevers, chills, night sweats, weight loss, swollen lymph nodes, body aches, joint swelling, chest pain, shortness of breath, mood changes. POSITIVE muscle aches  Objective  Blood pressure 112/80, pulse 89, height 5\' 4"  (1.626 m), weight 235 lb (106.6 kg), SpO2 98 %.   General: No apparent distress alert and oriented x3 mood and affect normal, dressed appropriately.  HEENT: Pupils equal, extraocular movements intact  Respiratory: Patient's speak in full sentences and does not appear short of breath  MSK:  Non tender with full range of motion and good stability and symmetric strength and tone of shoulders, elbows, wrist, hip, knee and ankles bilaterally.  Neck exam does have some mild loss of lordosis.  Patient actually has more pain going down the brachial arm at this time.  No significant weakness.  Tender to palpation to even light sensation.  Lack of extension and flexion and side bending to the left        Assessment and Plan:       The above documentation has been reviewed and is accurate and complete , DO

## 2020-02-16 ENCOUNTER — Encounter: Payer: Self-pay | Admitting: Family Medicine

## 2020-02-16 ENCOUNTER — Ambulatory Visit (INDEPENDENT_AMBULATORY_CARE_PROVIDER_SITE_OTHER): Payer: BC Managed Care – PPO | Admitting: Family Medicine

## 2020-02-16 ENCOUNTER — Other Ambulatory Visit: Payer: Self-pay

## 2020-02-16 DIAGNOSIS — M5030 Other cervical disc degeneration, unspecified cervical region: Secondary | ICD-10-CM

## 2020-02-16 MED ORDER — VALACYCLOVIR HCL 1 G PO TABS: 1000.0000 mg | ORAL_TABLET | Freq: Three times a day (TID) | ORAL | 2 refills | Status: AC

## 2020-02-16 NOTE — Assessment & Plan Note (Addendum)
Patient does have degenerative disc disease.  We will start with formal physical therapy which I think will be beneficial.  Continue the gabapentin at night.  Added Effexor during the day that I hope will be beneficial.  Warned of potential side effects and to call us and discontinue if it seems to be significantly worse.  Patient will start with formal physical therapy which is beneficial.  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

## 2020-02-16 NOTE — Patient Instructions (Signed)
Effexor 37.5 read side effects Second shingles vaccine you can take valtrex Get into PT See me again in 6-7 weeks

## 2020-02-19 ENCOUNTER — Telehealth: Payer: Self-pay | Admitting: Family Medicine

## 2020-02-19 NOTE — Telephone Encounter (Signed)
Pt saw Dr. Katrinka Blazing on Friday and they talked about prescribing Effexor, but I do not see that being done.  [facility]  [address] [employer]

## 2020-02-20 NOTE — Telephone Encounter (Signed)
Left message for patient to call back to discuss prescription.

## 2020-02-21 ENCOUNTER — Other Ambulatory Visit: Payer: Self-pay

## 2020-02-21 MED ORDER — VENLAFAXINE HCL ER 37.5 MG PO CP24: 37.5000 mg | ORAL_CAPSULE | Freq: Every day | ORAL | 0 refills | Status: AC

## 2020-02-21 NOTE — Telephone Encounter (Signed)
Spoke with patient. She would like to try Effexor. Script called in.

## 2020-02-26 NOTE — Progress Notes (Signed)
Pt present for annual exam. Pt stated that having side effects from the shingles vaccine and having a lot of issues with her left shoulder due to it. Pt will get flu vaccine next week.

## 2020-02-27 ENCOUNTER — Other Ambulatory Visit: Payer: Self-pay

## 2020-02-27 ENCOUNTER — Encounter: Payer: Self-pay | Admitting: Obstetrics and Gynecology

## 2020-02-27 ENCOUNTER — Other Ambulatory Visit (HOSPITAL_COMMUNITY)
Admission: RE | Admit: 2020-02-27 | Discharge: 2020-02-27 | Disposition: A | Discharge status: Home / Self Care | Payer: BC Managed Care – PPO | Source: Ambulatory Visit | Attending: Obstetrics and Gynecology | Admitting: Obstetrics and Gynecology

## 2020-02-27 ENCOUNTER — Ambulatory Visit (INDEPENDENT_AMBULATORY_CARE_PROVIDER_SITE_OTHER): Payer: BC Managed Care – PPO | Admitting: Obstetrics and Gynecology

## 2020-02-27 VITALS — BP 133/87 | HR 92 | Ht 64.0 in | Wt 230.9 lb

## 2020-02-27 DIAGNOSIS — Z8742 Personal history of other diseases of the female genital tract: Secondary | ICD-10-CM | POA: Diagnosis present

## 2020-02-27 DIAGNOSIS — Z01419 Encounter for gynecological examination (general) (routine) without abnormal findings: Secondary | ICD-10-CM | POA: Insufficient documentation

## 2020-02-27 DIAGNOSIS — F419 Anxiety disorder, unspecified: Secondary | ICD-10-CM

## 2020-02-27 DIAGNOSIS — G8929 Other chronic pain: Secondary | ICD-10-CM

## 2020-02-27 DIAGNOSIS — Z1211 Encounter for screening for malignant neoplasm of colon: Secondary | ICD-10-CM

## 2020-02-27 DIAGNOSIS — Z3041 Encounter for surveillance of contraceptive pills: Secondary | ICD-10-CM | POA: Diagnosis not present

## 2020-02-27 DIAGNOSIS — F32A Depression, unspecified: Secondary | ICD-10-CM

## 2020-02-27 DIAGNOSIS — Z803 Family history of malignant neoplasm of breast: Secondary | ICD-10-CM

## 2020-02-27 DIAGNOSIS — M25512 Pain in left shoulder: Secondary | ICD-10-CM | POA: Diagnosis not present

## 2020-02-27 MED ORDER — LEVONORGESTREL-ETHINYL ESTRAD 0.1-20 MG-MCG PO TABS: 1.0000 | ORAL_TABLET | Freq: Every day | ORAL | 3 refills | Status: DC

## 2020-02-27 NOTE — Progress Notes (Signed)
GYNECOLOGY ANNUAL PHYSICAL EXAM PROGRESS NOTE  Subjective:    Hannah Chang is a 54 y.o. 522P2002 single female who presents for an annual exam.  The patient is not currently sexually active. The patient wears seatbelts: yes. The patient participates in regular exercise: yes. Has the patient ever been transfused or tattooed?: no. The patient reports that there is not domestic violence in her life.    The patient has the following complaints today: 1. Reports side effects after having received the shingles vaccine. Namely, having some significant residual left shoulder pain, has been ongoing for .  2. Recently started Effexor last week to help with her pain, as well as her anxiety and depression. notes having some headaches associated with it. Missed dose this morning.    Gynecologic History  Menarche age: [age] No LMP recorded. (Menstrual status: Oral contraceptives). Contraception: OCP (estrogen/progesterone) - continuous regimen (used for migraine control) History of STI's: Denies Last Pap: 02/23/2019. Results were: abnormal (LGSIL). Previously had LGSIL pap smear in 2019 (LGSIL, , followed by colposcopy with biopsies in which noted CIN I)) 2018, ASCUS HPV+ pap smear in 2017 with CIN I on biopsies each year.  Last mammogram: 04/26/2018. Results were: negative Last colonoscopy: patient has never had one.     OB History  Gravida Para Term Preterm AB Living  2 2 2  0 0 2  SAB TAB Ectopic Multiple Live Births  0 0 0 0 2    # Outcome Date GA Lbr Len/2nd Weight Sex Delivery Anes PTL Lv  2 Term     M Vag-Spont   LIV  1 Term     F CS-Unspec   LIV    Past Medical History:  Diagnosis Date  . Environmental allergies   . Exercise-induced asthma   . Migraines   . Shingles     Past Surgical History:  Procedure Laterality Date  . CESAREAN SECTION  1992  . EYE SURGERY    . NASAL SEPTUM SURGERY     X 2  . TONSILLECTOMY      Family History  Problem Relation Age of Onset  . Breast  cancer Mother [id number]  . Breast cancer Maternal Aunt [id number]    Social History   Socioeconomic History  . Marital status: Divorced    Spouse name: Not on file  . Number of children: Not on file  . Years of education: Not on file  . Highest education level: Not on file  Occupational History  . Not on file  Tobacco Use  . Smoking status: Never Smoker  . Smokeless tobacco: Never Used  Vaping Use  . Vaping Use: Never used  Substance and Sexual Activity  . Alcohol use: Yes    Comment: ONCE A MONTH  . Drug use: No  . Sexual activity: Not Currently    Birth control/protection: None, Pill  Other Topics Concern  . Not on file  Social History Narrative  . Not on file   Social Determinants of Health   Financial Resource Strain:   . Difficulty of Paying Living Expenses: Not on file  Food Insecurity:   . Worried About Programme researcher, broadcasting/film/videounning Out of Food in the Last Year: Not on file  . Ran Out of Food in the Last Year: Not on file  Transportation Needs:   . Lack of Transportation (Medical): Not on file  . Lack of Transportation (Non-Medical): Not on file  Physical Activity:   . Days of Exercise per Week: Not on file  .  Minutes of Exercise per Session: Not on file  Stress:   . Feeling of Stress : Not on file  Social Connections:   . Frequency of Communication with Friends and Family: Not on file  . Frequency of Social Gatherings with Friends and Family: Not on file  . Attends Religious Services: Not on file  . Active Member of Clubs or Organizations: Not on file  . Attends Banker Meetings: Not on file  . Marital Status: Not on file  Intimate Partner Violence:   . Fear of Current or Ex-Partner: Not on file  . Emotionally Abused: Not on file  . Physically Abused: Not on file  . Sexually Abused: Not on file    Current Outpatient Medications on File Prior to Visit  Medication Sig Dispense Refill  . amitriptyline (ELAVIL) 10 MG tablet Take 10 mg by mouth at bedtime.    Janann Colonel 0.1-20  MG-MCG tablet Take 1 tablet by mouth once daily 84 tablet 2  . fexofenadine (ALLEGRA) 180 MG tablet Take 180 mg by mouth as needed for allergies or rhinitis.    . fluticasone (FLONASE) 50 MCG/ACT nasal spray Place into both nostrils daily.    Marland Kitchen gabapentin (NEURONTIN) 100 MG capsule Take 2 capsules (200 mg total) by mouth at bedtime. 180 capsule 3  . ipratropium (ATROVENT) 0.02 % nebulizer solution Take 2.5 mLs (0.5 mg total) by nebulization every 6 (six) hours as needed for wheezing or shortness of breath. 75 mL 12  . levalbuterol (XOPENEX HFA) 45 MCG/ACT inhaler Inhale 1-2 puffs into the lungs every 4 (four) hours as needed for wheezing.    . meloxicam (MOBIC) 15 MG tablet Take 1 tablet (15 mg total) by mouth daily. 30 tablet 0  . Respiratory Therapy Supplies (NEBULIZER) DEVI 1 Device by Does not apply route 2 (two) times daily as needed. 1 each 0  . valACYclovir (VALTREX) 1000 MG tablet Take 1 tablet (1,000 mg total) by mouth 3 (three) times daily. Take at first sign of rash 21 tablet 2  . venlafaxine XR (EFFEXOR XR) 37.5 MG 24 hr capsule Take 1 capsule (37.5 mg total) by mouth daily with breakfast. 30 capsule 0   No current facility-administered medications on file prior to visit.    Allergies  Allergen Reactions  . Pistachio Nut (Diagnostic) Itching    Internal itch   . Prednisone Cough    Muscle spasms in chest  . Cephalexin Other (See Comments)    REACTION: whelps REACTION: whelps  . Corn-Containing Products   . Cucumber Extract   . Doxycycline Hyclate     REACTION: as child  . Doxycycline Hyclate Other (See Comments)    REACTION: as child  . Erythromycin Other (See Comments)    REACTION: as child REACTION: as child  . Other     Potatoes  . Sulfa Antibiotics Other (See Comments)    Because of the reaction she had with Keflex  Because of the reaction she had with Keflex  UNKNOWN  . Tomato   . Wheat Bran      Review of Systems Constitutional: negative for chills,  fatigue, fevers and sweats Eyes: negative for irritation, redness and visual disturbance Ears, nose, mouth, throat, and face: negative for hearing loss, nasal congestion, snoring and tinnitus Respiratory: negative for asthma, cough, sputum Cardiovascular: negative for chest pain, dyspnea, exertional chest pressure/discomfort, irregular heart beat, palpitations and syncope Gastrointestinal: negative for abdominal pain, change in bowel habits, nausea and vomiting Genitourinary: negative for abnormal  menstrual periods, genital lesions, sexual problems and vaginal discharge, dysuria and urinary incontinence Integument/breast: negative for breast lump, breast tenderness and nipple discharge. Hematologic/lymphatic: negative for bleeding and easy bruising Musculoskeletal:negative for back pain and muscle weakness. Positive for left shoulder pain (chronic) Neurological: negative for dizziness, headaches, vertigo and weakness Endocrine: negative for diabetic symptoms including polydipsia, polyuria and skin dryness Allergic/Immunologic: negative for hay fever and urticaria      Objective:  Blood pressure 133/87, pulse 92, height 5\' 4"  (1.626 m), weight 230 lb 14.4 oz (104.7 kg). Body mass index is 39.63 kg/m.  General Appearance:    Alert, cooperative, no distress, appears stated age, moderately obese  Head:    Normocephalic, without obvious abnormality, atraumatic  Eyes:    PERRL, conjunctiva/corneas clear, EOM's intact, both eyes  Ears:    Normal external ear canals, both ears  Nose:   Nares normal, septum midline, mucosa normal, no drainage or sinus tenderness  Throat:   Lips, mucosa, and tongue normal; teeth and gums normal  Neck:   Supple, symmetrical, trachea midline, no adenopathy; thyroid: no enlargement/tenderness/nodules; no carotid bruit or JVD  Back:     Symmetric, no curvature, ROM normal, no CVA tenderness  Lungs:     Clear to auscultation bilaterally, respirations unlabored  Chest  Wall:    No tenderness or deformity   Heart:    Regular rate and rhythm, S1 and S2 normal, no murmur, rub or gallop  Breast Exam:    No tenderness, masses, or nipple abnormality.   Abdomen:     Soft, non-tender, bowel sounds active all four quadrants, no masses, no organomegaly.    Genitalia:    Pelvic:external genitalia normal, vagina without lesions, discharge, or tenderness, rectovaginal septum  normal. Cervix normal in appearance, no cervical motion tenderness, no adnexal masses or tenderness.  Uterus normal size, shape, mobile, regular contours, nontender.  Rectal:    Normal external sphincter.  No hemorrhoids appreciated. Internal exam not done.   Extremities:   Extremities normal, atraumatic, no cyanosis or edema  Pulses:   2+ and symmetric all extremities  Skin:   Skin color, texture, turgor normal, no rashes or lesions.  Multiple freckles present.   Lymph nodes:   Cervical, supraclavicular, and axillary nodes normal  Neurologic:   CNII-XII intact, normal strength, sensation and reflexes throughout   .  Labs:  Labs per PCP, to be performed in November ([facility])  Assessment:   Routine annual gynecologic exam.  Morbid obesity CIN I Family h/o breast cancer Contraception surveillance Shoulder pain   Plan:    Blood tests ordered: Labs to be performed by PCP in November.  Breast self exam technique reviewed and patient encouraged to perform self-exam monthly. Contraception: OCP (estrogen/progesterone).  Helps to manage menstrual migraines. Discussed that next year, we should consider a hormone-free trial period to see if OCPs are still needed as patient is nearing age of menopause.  Discussed healthy lifestyle modifications. Pap smear performed today due to h/o abnormal pap smear and CIN I. Mammogram ordered.  Family h/o breast cancer, previously discussed genetic cancer screening.  Patient still has not decided. Encouraged at least to continue routine yearly mammograms.    Patient will hold on getting flu vaccine until next week.  Discussed options for colon cancer screening including FOBT, serum colon cancer screening, and colonoscopy. Discussed risks/benefits of each. Patient ok to schedule colonoscopy.  COVID vaccination: declines.  Anxiety and depression currently being managed by PCP.  Shoulder pain residual from  recent shingles outbreak, recently s/p vaccination. Managed by PCP.  To f/u in 1 year for annual exam.     Hildred Laser, MD [facility]

## 2020-02-27 NOTE — Patient Instructions (Addendum)
Preventive Care 40-52 Years Old, Female Preventive care refers to visits with your health care provider and lifestyle choices that can promote health and wellness. This includes:  A yearly physical exam. This may also be called an annual well check.  Regular dental visits and eye exams.  Immunizations.  Screening for certain conditions.  Healthy lifestyle choices, such as eating a healthy diet, getting regular exercise, not using drugs or products that contain nicotine and tobacco, and limiting alcohol use. What can I expect for my preventive care visit? Physical exam Your health care provider will check your:  Height and weight. This may be used to calculate body mass index (BMI), which tells if you are at a healthy weight.  Heart rate and blood pressure.  Skin for abnormal spots. Counseling Your health care provider may ask you questions about your:  Alcohol, tobacco, and drug use.  Emotional well-being.  Home and relationship well-being.  Sexual activity.  Eating habits.  Work and work environment.  Method of birth control.  Menstrual cycle.  Pregnancy history. What immunizations do I need?  Influenza (flu) vaccine  This is recommended every year. Tetanus, diphtheria, and pertussis (Tdap) vaccine  You may need a Td booster every 10 years. Varicella (chickenpox) vaccine  You may need this if you have not been vaccinated. Zoster (shingles) vaccine  You may need this after age 60. Measles, mumps, and rubella (MMR) vaccine  You may need at least one dose of MMR if you were born in 1957 or later. You may also need a second dose. Pneumococcal conjugate (PCV13) vaccine  You may need this if you have certain conditions and were not previously vaccinated. Pneumococcal polysaccharide (PPSV23) vaccine  You may need one or two doses if you smoke cigarettes or if you have certain conditions. Meningococcal conjugate (MenACWY) vaccine  You may need this if you  have certain conditions. Hepatitis A vaccine  You may need this if you have certain conditions or if you travel or work in places where you may be exposed to hepatitis A. Hepatitis B vaccine  You may need this if you have certain conditions or if you travel or work in places where you may be exposed to hepatitis B. Haemophilus influenzae type b (Hib) vaccine  You may need this if you have certain conditions. Human papillomavirus (HPV) vaccine  If recommended by your health care provider, you may need three doses over 6 months. You may receive vaccines as individual doses or as more than one vaccine together in one shot (combination vaccines). Talk with your health care provider about the risks and benefits of combination vaccines. What tests do I need? Blood tests  Lipid and cholesterol levels. These may be checked every 5 years, or more frequently if you are over 50 years old.  Hepatitis C test.  Hepatitis B test. Screening  Lung cancer screening. You may have this screening every year starting at age 55 if you have a 30-pack-year history of smoking and currently smoke or have quit within the past 15 years.  Colorectal cancer screening. All adults should have this screening starting at age 50 and continuing until age 75. Your health care provider may recommend screening at age 45 if you are at increased risk. You will have tests every 1-10 years, depending on your results and the type of screening test.  Diabetes screening. This is done by checking your blood sugar (glucose) after you have not eaten for a while (fasting). You may have this   done every 1-3 years.  Mammogram. This may be done every 1-2 years. Talk with your health care provider about when you should start having regular mammograms. This may depend on whether you have a family history of breast cancer.  BRCA-related cancer screening. This may be done if you have a family history of breast, ovarian, tubal, or peritoneal  cancers.  Pelvic exam and Pap test. This may be done every 3 years starting at age 70. Starting at age 87, this may be done every 5 years if you have a Pap test in combination with an HPV test. Other tests  Sexually transmitted disease (STD) testing.  Bone density scan. This is done to screen for osteoporosis. You may have this scan if you are at high risk for osteoporosis. Follow these instructions at home: Eating and drinking  Eat a diet that includes fresh fruits and vegetables, whole grains, lean protein, and low-fat dairy.  Take vitamin and mineral supplements as recommended by your health care provider.  Do not drink alcohol if: ? Your health care provider tells you not to drink. ? You are pregnant, may be pregnant, or are planning to become pregnant.  If you drink alcohol: ? Limit how much you have to 0-1 drink a day. ? Be aware of how much alcohol is in your drink. In the [place] one drink equals one 12 oz bottle of beer (355 mL), one 5 oz glass of wine (148 mL), or one 1 oz glass of hard liquor (44 mL). Lifestyle  Take daily care of your teeth and gums.  Stay active. Exercise for at least 30 minutes on 5 or more days each week.  Do not use any products that contain nicotine or tobacco, such as cigarettes, e-cigarettes, and chewing tobacco. If you need help quitting, ask your health care provider.  If you are sexually active, practice safe sex. Use a condom or other form of birth control (contraception) in order to prevent pregnancy and STIs (sexually transmitted infections).  If told by your health care provider, take low-dose aspirin daily starting at age 59. What's next?  Visit your health care provider once a year for a well check visit.  Ask your health care provider how often you should have your eyes and teeth checked.  Stay up to date on all vaccines. This information is not intended to replace advice given to you by your health care provider. Make sure you  discuss any questions you have with your health care provider. Document Revised: 01/13/2018 Document Reviewed: 01/13/2018 Elsevier Patient Education  2020 [employer] Breast self-awareness is knowing how your breasts look and feel. Doing breast self-awareness is important. It allows you to catch a breast problem early while it is still small and can be treated. All women should do breast self-awareness, including women who have had breast implants. Tell your doctor if you notice a change in your breasts. What you need:  A mirror.  A well-lit room. How to do a breast self-exam A breast self-exam is one way to learn what is normal for your breasts and to check for changes. To do a breast self-exam: Look for changes  1. Take off all the clothes above your waist. 2. Stand in front of a mirror in a room with good lighting. 3. Put your hands on your hips. 4. Push your hands down. 5. Look at your breasts and nipples in the mirror to see if one breast or nipple looks different  other. Check to see if: ? The shape of one breast is different. ? The size of one breast is different. ? There are wrinkles, dips, and bumps in one breast and not the other. 6. Look at each breast for changes in the skin, such as: ? Redness. ? Scaly areas. 7. Look for changes in your nipples, such as: ? Liquid around the nipples. ? Bleeding. ? Dimpling. ? Redness. ? A change in where the nipples are. Feel for changes  1. Lie on your back on the floor. 2. Feel each breast. To do this, follow these steps: ? Pick a breast to feel. ? Put the arm closest to that breast above your head. ? Use your other arm to feel the nipple area of your breast. Feel the area with the pads of your three middle fingers by making small circles with your fingers. For the first circle, press lightly. For the second circle, press harder. For the third circle, press even harder. ? Keep making circles with  your fingers at the different pressures as you move down your breast. Stop when you feel your ribs. ? Move your fingers a little toward the center of your body. ? Start making circles with your fingers again, this time going up until you reach your collarbone. ? Keep making up-and-down circles until you reach your armpit. Remember to keep using the three pressures. ? Feel the other breast in the same way. 3. Sit or stand in the tub or shower. 4. With soapy water on your skin, feel each breast the same way you did in step 2 when you were lying on the floor. Write down what you find Writing down what you find can help you remember what to tell your doctor. Write down:  What is normal for each breast.  Any changes you find in each breast, including: ? The kind of changes you find. ? Whether you have pain. ? Size and location of any lumps.  When you last had your menstrual period. General tips  Check your breasts every month.  If you are breastfeeding, the best time to check your breasts is after you feed your baby or after you use a breast pump.  If you get menstrual periods, the best time to check your breasts is 5-7 days after your menstrual period is over.  With time, you will become comfortable with the self-exam, and you will begin to know if there are changes in your breasts. Contact a doctor if you:  See a change in the shape or size of your breasts or nipples.  See a change in the skin of your breast or nipples, such as red or scaly skin.  Have fluid coming from your nipples that is not normal.  Find a lump or thick area that was not there before.  Have pain in your breasts.  Have any concerns about your breast health. Summary  Breast self-awareness includes looking for changes in your breasts, as well as feeling for changes within your breasts.  Breast self-awareness should be done in front of a mirror in a well-lit room.  You should check your breasts every month.  If you get menstrual periods, the best time to check your breasts is 5-7 days after your menstrual period is over.  Let your doctor know of any changes you see in your breasts, including changes in size, changes on the skin, pain or tenderness, or fluid from your nipples that is not normal. This information is not   is not intended to replace advice given to you by your health care provider. Make sure you discuss any questions you have with your health care provider. Document Revised: 12/21/2017 Document Reviewed: 12/21/2017 Elsevier Patient Education  [employer].

## 2020-02-29 LAB — CYTOLOGY - PAP: Diagnosis: HIGH — AB

## 2020-03-05 ENCOUNTER — Encounter: Payer: Self-pay | Admitting: General Practice

## 2020-04-01 ENCOUNTER — Ambulatory Visit: Payer: BC Managed Care – PPO | Admitting: Family Medicine

## 2020-05-14 ENCOUNTER — Telehealth: Payer: Self-pay

## 2020-05-14 ENCOUNTER — Other Ambulatory Visit: Payer: Self-pay

## 2020-05-14 ENCOUNTER — Ambulatory Visit: Payer: BC Managed Care – PPO

## 2020-05-14 ENCOUNTER — Ambulatory Visit
Admission: RE | Admit: 2020-05-14 | Discharge: 2020-05-14 | Disposition: A | Discharge status: Home / Self Care | Payer: BC Managed Care – PPO | Source: Ambulatory Visit | Attending: Obstetrics and Gynecology | Admitting: Obstetrics and Gynecology

## 2020-05-14 ENCOUNTER — Other Ambulatory Visit: Payer: Self-pay | Admitting: Obstetrics and Gynecology

## 2020-05-14 DIAGNOSIS — Z01419 Encounter for gynecological examination (general) (routine) without abnormal findings: Secondary | ICD-10-CM | POA: Insufficient documentation

## 2020-05-14 DIAGNOSIS — Z3041 Encounter for surveillance of contraceptive pills: Secondary | ICD-10-CM

## 2020-05-14 DIAGNOSIS — Z1231 Encounter for screening mammogram for malignant neoplasm of breast: Secondary | ICD-10-CM | POA: Insufficient documentation

## 2020-05-14 NOTE — Telephone Encounter (Signed)
Patient called in stating that she needs a refill on her birth control. Patient was last seen in October of 2021. Patient has already called her pharmacy and they told her she didn't have any more refills.  Could you please advise?

## 2020-05-14 NOTE — Telephone Encounter (Signed)
Pt called no answer LM via VM stating her concerns for calling the office. Pt was informed via VM that her medication was refilled on 02/27/2020 with 84 tablets to be dispensed with 3 refills.

## 2020-05-14 NOTE — Telephone Encounter (Signed)
Spoke to pharmacy concerning the pt's medication was informed that the medication was sent to [address] but our RX receipt states [facility].

## 2020-05-16 NOTE — Telephone Encounter (Signed)
Spoke to pt to see if she had picked up her medication. [place] be closer to her children.

## 2020-06-03 ENCOUNTER — Ambulatory Visit: Payer: BC Managed Care – PPO | Admitting: Gastroenterology

## 2020-09-26 ENCOUNTER — Telehealth: Payer: Self-pay

## 2020-09-26 NOTE — Telephone Encounter (Signed)
Pt called no answer LM via VM that I was calling concerning her abnormal pap smear and scheduling an appt with [facility] for a colpo. Advised pt to please contact to schedule an appt.

## 2020-11-24 ENCOUNTER — Other Ambulatory Visit: Payer: Self-pay | Admitting: Obstetrics and Gynecology

## 2020-11-24 DIAGNOSIS — Z3041 Encounter for surveillance of contraceptive pills: Secondary | ICD-10-CM

## 2021-03-04 ENCOUNTER — Encounter: Payer: Self-pay | Admitting: Obstetrics and Gynecology

## 2021-03-05 ENCOUNTER — Encounter: Payer: Self-pay | Admitting: Obstetrics and Gynecology

## 2022-11-04 IMAGING — MG DIGITAL SCREENING BILAT W/ TOMO W/ CAD
8 series · 8 of 24 positions shown · non-contrast
Comparison: Previous exam(s).

CLINICAL DATA: Screening.

EXAM:
DIGITAL SCREENING BILATERAL MAMMOGRAM WITH TOMO AND CAD

[L CC synth-2D]
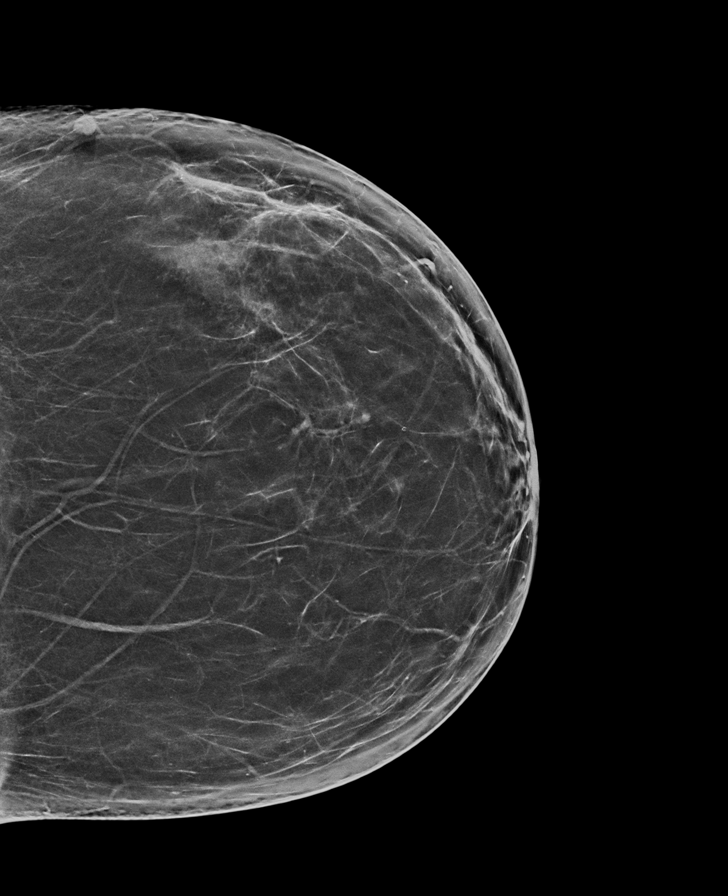

[L MLO synth-2D]
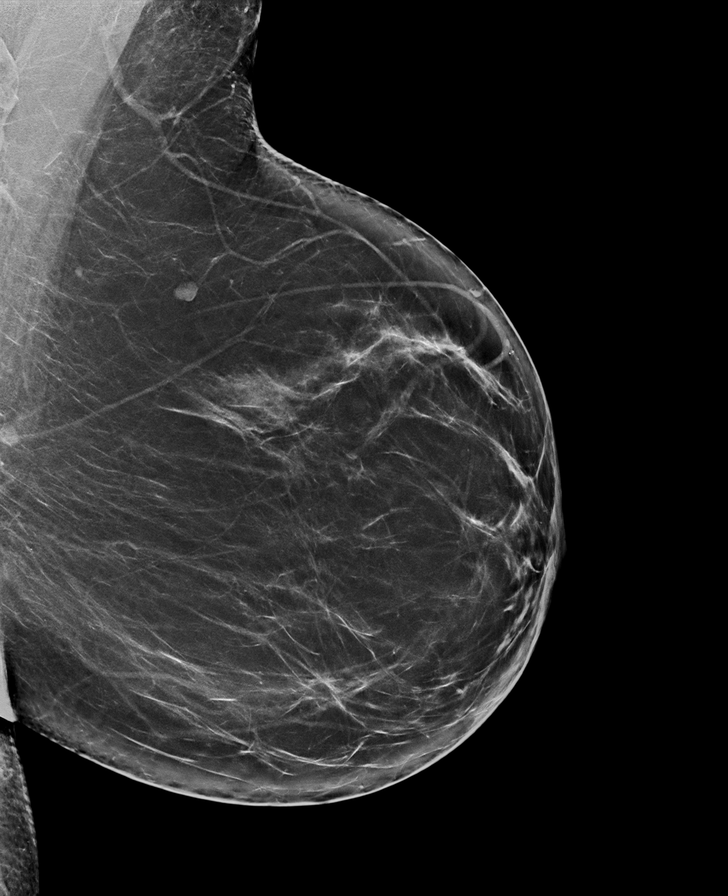

[R CC synth-2D]
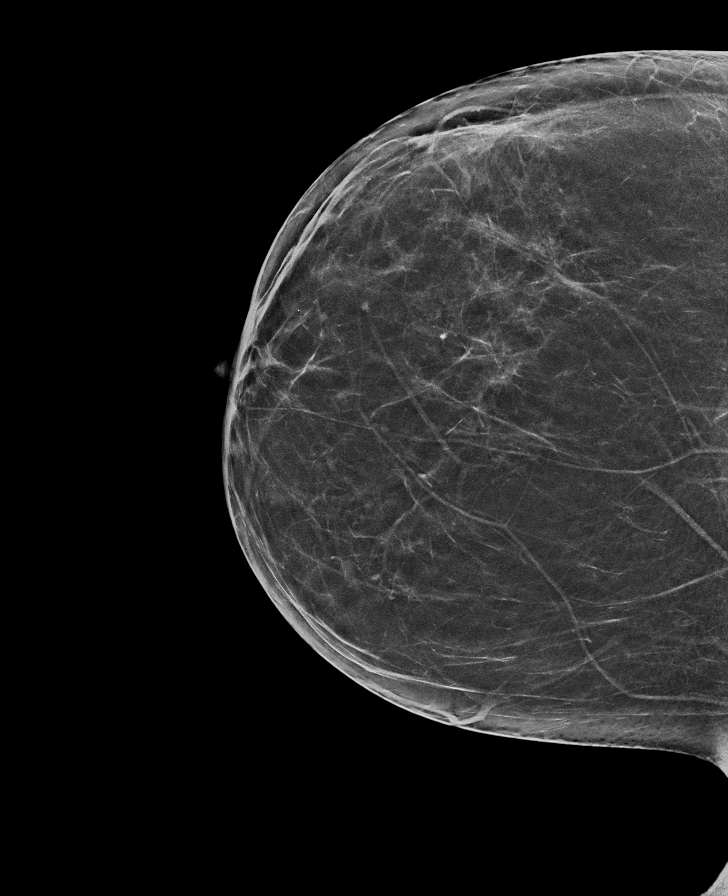

[R MLO synth-2D]
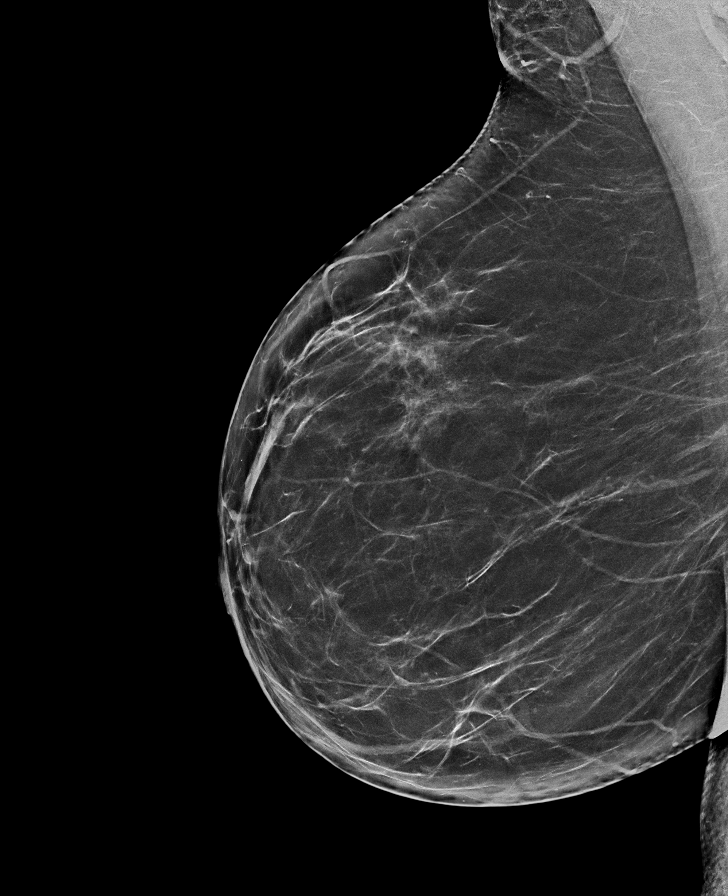

[L CC tomo · tomo slice 35/70.0]
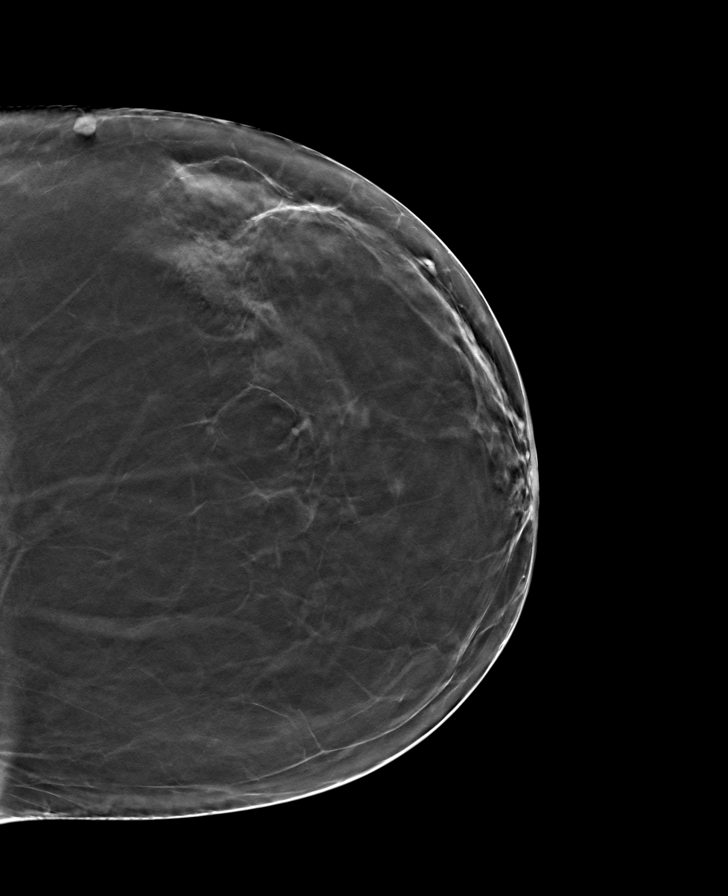

[R MLO tomo · tomo slice 39/76.0]
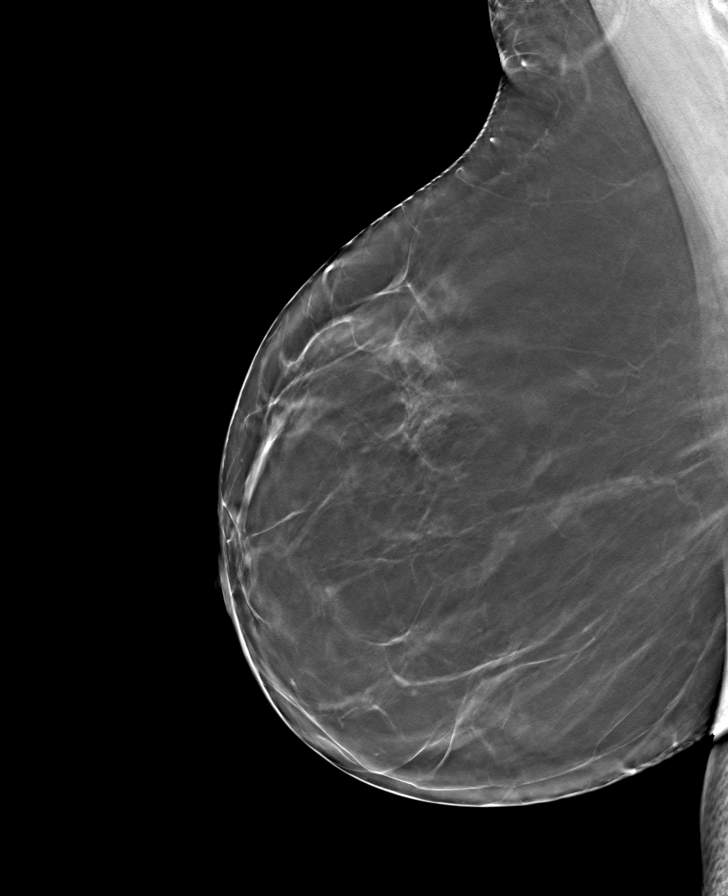

[R CC tomo · tomo slice 33/66.0]
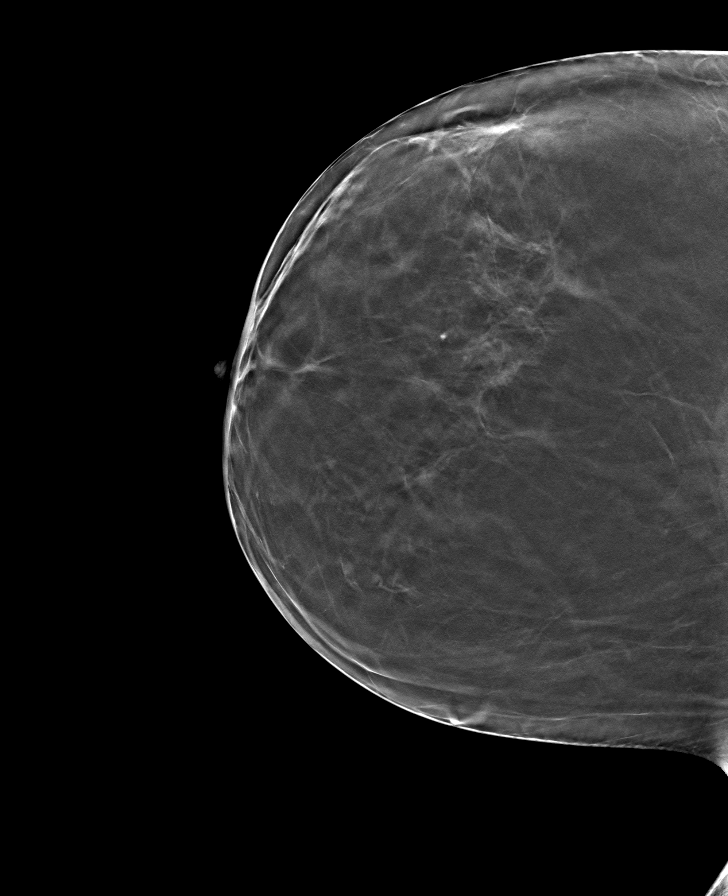

[L MLO tomo · tomo slice 42/83.0]
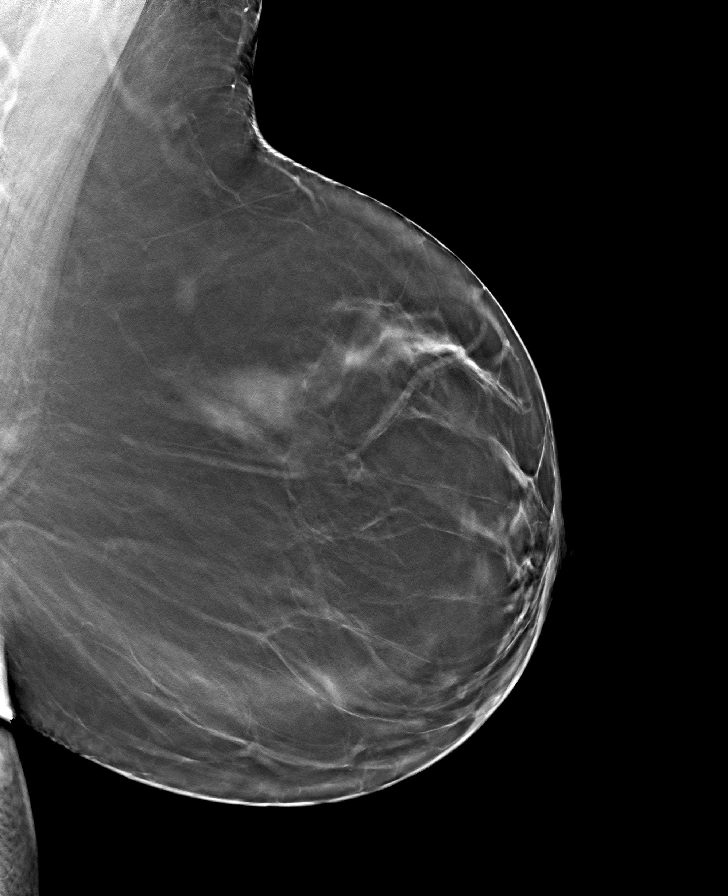

[8 of 24 positions shown; findings below may reference images not displayed]

ACR Breast Density Category b: There are scattered areas of
fibroglandular density.
FINDINGS: There are no findings suspicious for malignancy. Images were
processed with CAD.
IMPRESSION: No mammographic evidence of malignancy. A result letter of this
screening mammogram will be mailed directly to the patient.

RECOMMENDATION:
Screening mammogram in one year. (Code:[id number])

BI-RADS CATEGORY  1: Negative.
